# Patient Record
Sex: Female | Born: 1960 | Race: White | Hispanic: Yes | Smoking: Never smoker
Health system: Southern US, Community
[De-identification: ages and names within clinical notes are randomized; demographics above are authoritative.]

---

## 1999-02-10 ENCOUNTER — Inpatient Hospital Stay (HOSPITAL_COMMUNITY): Admission: AD | Admit: 1999-02-10 | Discharge: 1999-02-11 | Payer: Self-pay | Admitting: *Deleted

## 2008-07-07 ENCOUNTER — Inpatient Hospital Stay (HOSPITAL_COMMUNITY): Admission: AD | Admit: 2008-07-07 | Discharge: 2008-07-07 | Payer: Self-pay | Admitting: Obstetrics & Gynecology

## 2009-03-17 ENCOUNTER — Ambulatory Visit: Payer: Self-pay | Admitting: Gynecology

## 2009-03-24 ENCOUNTER — Ambulatory Visit: Payer: Self-pay | Admitting: Gynecology

## 2009-04-11 ENCOUNTER — Ambulatory Visit: Payer: Self-pay | Admitting: Gynecology

## 2009-04-14 ENCOUNTER — Ambulatory Visit (HOSPITAL_COMMUNITY): Admission: RE | Admit: 2009-04-14 | Discharge: 2009-04-14 | Payer: Self-pay | Admitting: Gynecology

## 2009-04-25 ENCOUNTER — Ambulatory Visit: Payer: Self-pay | Admitting: Gynecology

## 2010-06-20 LAB — WET PREP, GENITAL
Trich, Wet Prep: NONE SEEN
Yeast Wet Prep HPF POC: NONE SEEN

## 2011-05-24 ENCOUNTER — Ambulatory Visit
Admission: RE | Admit: 2011-05-24 | Discharge: 2011-05-24 | Disposition: A | Discharge status: Home / Self Care | Payer: No Typology Code available for payment source | Source: Ambulatory Visit | Attending: Geriatric Medicine | Admitting: Geriatric Medicine

## 2011-05-24 ENCOUNTER — Other Ambulatory Visit: Payer: Self-pay | Admitting: Geriatric Medicine

## 2011-05-24 DIAGNOSIS — R102 Pelvic and perineal pain: Secondary | ICD-10-CM

## 2012-10-22 ENCOUNTER — Emergency Department (HOSPITAL_COMMUNITY): Payer: Medicaid Other

## 2012-10-22 ENCOUNTER — Encounter (HOSPITAL_COMMUNITY): Payer: Self-pay | Admitting: Emergency Medicine

## 2012-10-22 ENCOUNTER — Observation Stay (HOSPITAL_COMMUNITY)
Admission: EM | Admit: 2012-10-22 | Discharge: 2012-10-24 | Disposition: A | Discharge status: Home / Self Care | Payer: Medicaid Other | Attending: Surgery | Admitting: Surgery

## 2012-10-22 DIAGNOSIS — K8020 Calculus of gallbladder without cholecystitis without obstruction: Secondary | ICD-10-CM

## 2012-10-22 DIAGNOSIS — R109 Unspecified abdominal pain: Secondary | ICD-10-CM

## 2012-10-22 DIAGNOSIS — K8000 Calculus of gallbladder with acute cholecystitis without obstruction: Principal | ICD-10-CM | POA: Insufficient documentation

## 2012-10-22 DIAGNOSIS — K8010 Calculus of gallbladder with chronic cholecystitis without obstruction: Secondary | ICD-10-CM

## 2012-10-22 DIAGNOSIS — Z79899 Other long term (current) drug therapy: Secondary | ICD-10-CM | POA: Insufficient documentation

## 2012-10-22 DIAGNOSIS — K8050 Calculus of bile duct without cholangitis or cholecystitis without obstruction: Secondary | ICD-10-CM | POA: Diagnosis present

## 2012-10-22 LAB — URINALYSIS, ROUTINE W REFLEX MICROSCOPIC
Glucose, UA: NEGATIVE mg/dL
Ketones, ur: NEGATIVE mg/dL
Protein, ur: NEGATIVE mg/dL

## 2012-10-22 LAB — COMPREHENSIVE METABOLIC PANEL
BUN: 16 mg/dL (ref 6–23)
CO2: 25 mEq/L (ref 19–32)
Chloride: 104 mEq/L (ref 96–112)
Creatinine, Ser: 0.69 mg/dL (ref 0.50–1.10)
GFR calc non Af Amer: >90 mL/min (ref >90)
Total Bilirubin: 0.3 mg/dL (ref 0.3–1.2)

## 2012-10-22 LAB — CBC WITH DIFFERENTIAL/PLATELET
Eosinophils Absolute: 0.1 10*3/uL (ref 0.0–0.7)
Lymphocytes Relative: 21 % (ref 12–46)
Lymphs Abs: 1.7 10*3/uL (ref 0.7–4.0)
Neutrophils Relative %: 72 % (ref 43–77)
Platelets: 222 10*3/uL (ref 150–400)
RBC: 4.81 MIL/uL (ref 3.87–5.11)
WBC: 8 10*3/uL (ref 4.0–10.5)

## 2012-10-22 LAB — URINE MICROSCOPIC-ADD ON

## 2012-10-22 MED ORDER — MORPHINE SULFATE 4 MG/ML IJ SOLN: 4.0000 mg | Freq: Once | INTRAMUSCULAR | Status: DC

## 2012-10-22 MED ORDER — CIPROFLOXACIN IN D5W 400 MG/200ML IV SOLN
400.0000 mg | Freq: Two times a day (BID) | INTRAVENOUS | Status: DC
  Administered 2012-10-22 – 2012-10-23 (×2): 400 mg via INTRAVENOUS
  Filled 2012-10-22 (×4): qty 200

## 2012-10-22 MED ORDER — HYDROMORPHONE HCL PF 1 MG/ML IJ SOLN
0.5000 mg | INTRAMUSCULAR | Status: DC | PRN
  Administered 2012-10-22: 1 mg via INTRAVENOUS
  Administered 2012-10-23 (×2): 0.5 mg via INTRAVENOUS
  Administered 2012-10-23 – 2012-10-24 (×5): 1 mg via INTRAVENOUS
  Filled 2012-10-22 (×7): qty 1

## 2012-10-22 MED ORDER — OMEPRAZOLE 20 MG PO CPDR: 20.0000 mg | DELAYED_RELEASE_CAPSULE | Freq: Every day | ORAL | Status: DC

## 2012-10-22 MED ORDER — DIPHENHYDRAMINE HCL 12.5 MG/5ML PO ELIX
12.5000 mg | ORAL_SOLUTION | Freq: Four times a day (QID) | ORAL | Status: DC | PRN
Start: 2012-10-22 — End: 2012-10-24

## 2012-10-22 MED ORDER — MORPHINE SULFATE 4 MG/ML IJ SOLN
4.0000 mg | Freq: Once | INTRAMUSCULAR | Status: AC
  Administered 2012-10-22: 4 mg via INTRAVENOUS
  Filled 2012-10-22: qty 1

## 2012-10-22 MED ORDER — DIPHENHYDRAMINE HCL 50 MG/ML IJ SOLN
12.5000 mg | Freq: Four times a day (QID) | INTRAMUSCULAR | Status: DC | PRN
  Administered 2012-10-22: 25 mg via INTRAVENOUS
  Filled 2012-10-22: qty 1

## 2012-10-22 MED ORDER — ONDANSETRON 4 MG PO TBDP
4.0000 mg | ORAL_TABLET | Freq: Once | ORAL | Status: AC
  Administered 2012-10-22: 4 mg via ORAL
  Filled 2012-10-22: qty 1

## 2012-10-22 MED ORDER — CIPROFLOXACIN IN D5W 400 MG/200ML IV SOLN: 400.0000 mg | Freq: Once | INTRAVENOUS | Status: DC

## 2012-10-22 MED ORDER — GI COCKTAIL ~~LOC~~
30.0000 mL | Freq: Once | ORAL | Status: AC
  Administered 2012-10-22: 30 mL via ORAL
  Filled 2012-10-22: qty 30

## 2012-10-22 MED ORDER — ONDANSETRON HCL 4 MG/2ML IJ SOLN
4.0000 mg | Freq: Four times a day (QID) | INTRAMUSCULAR | Status: DC | PRN
  Administered 2012-10-22: 4 mg via INTRAVENOUS
  Filled 2012-10-22 (×2): qty 2

## 2012-10-22 MED ORDER — KCL IN DEXTROSE-NACL 20-5-0.45 MEQ/L-%-% IV SOLN
INTRAVENOUS | Status: DC
  Administered 2012-10-23 – 2012-10-24 (×3): via INTRAVENOUS
  Filled 2012-10-22 (×8): qty 1000

## 2012-10-22 MED ORDER — MORPHINE SULFATE 4 MG/ML IJ SOLN
4.0000 mg | Freq: Once | INTRAMUSCULAR | Status: AC
  Administered 2012-10-22: 4 mg via INTRAMUSCULAR
  Filled 2012-10-22: qty 1

## 2012-10-22 NOTE — ED Notes (Signed)
Patient in Ultrasound.

## 2012-10-22 NOTE — ED Notes (Signed)
Pt has epigastric pain but no abdominal tenderness. Worse with movement. Afebrile.

## 2012-10-22 NOTE — ED Provider Notes (Signed)
CSN: [id number]     Arrival date & time 10/22/12  0751 History     First MD Initiated Contact with Patient 10/22/12 0800     Chief Complaint  Patient presents with  . Abdominal Pain   (Consider location/radiation/quality/duration/timing/severity/associated sxs/prior Treatment) HPI 52 year old female with a significant medical history presents with epigastric abdominal pain of 2 days duration. Patient reports that the pain began day before yesterday, with mild originally and alleviated by go, however yesterday appreciate constant more severe pain all day and the pain was so severe last night that kept her from sleep.  Patient reports that the pain is worse and worse with palpation and walking, and is unassociated with eating.  It radiates to the back.  Says that she has an appetite however has nausea which is keeping her from eating. Notes urinary frequency, but reports she believes is from drinking more liquids. Denies any vaginal bleeding or discharge. Denies diarrhea, constipation, blood in stool, fever.  No past medical history on file. No past surgical history on file. No family history on file. History  Substance Use Topics  . Smoking status: Never Smoker   . Smokeless tobacco: Not on file  . Alcohol Use: No   OB History   Grav Para Term Preterm Abortions TAB SAB Ect Mult Living                 Review of Systems  Constitutional: Negative for fever.  HENT: Negative for sore throat and neck pain.   Eyes: Negative for visual disturbance.  Respiratory: Negative for cough and shortness of breath.   Cardiovascular: Negative for chest pain.  Gastrointestinal: Positive for nausea and abdominal pain. Negative for vomiting, diarrhea, constipation and blood in stool.  Genitourinary: Negative for difficulty urinating.  Musculoskeletal: Positive for back pain.  Skin: Negative for rash.  Neurological: Negative for syncope and headaches.    Allergies  Shrimp  Home Medications    Current Outpatient Rx  Name  Route  Sig  Dispense  Refill  . ibuprofen (ADVIL,MOTRIN) 200 MG tablet   Oral   Take 400-600 mg by mouth daily as needed for pain. Usually takes 2 tablets (400 mg), 3 tablets (600 mg) for severe pain         . OVER THE COUNTER MEDICATION   Oral   Take 1 tablet by mouth daily. Vitamin for bones         . omeprazole (PRILOSEC) 20 MG capsule   Oral   Take 1 capsule (20 mg total) by mouth daily.   30 capsule   0    BP 142/94  Pulse 69  Temp(Src) 98.7 F (37.1 C) (Oral)  Resp 16  SpO2 95% Physical Exam  Nursing note and vitals reviewed. Constitutional: She is oriented to person, place, and time. She appears well-developed and well-nourished. No distress.  HENT:  Head: Normocephalic and atraumatic.  Eyes: Conjunctivae and EOM are normal.  Neck: Normal range of motion.  Cardiovascular: Normal rate, regular rhythm, normal heart sounds and intact distal pulses.  Exam reveals no gallop and no friction rub.   No murmur heard. Pulmonary/Chest: Effort normal and breath sounds normal. No respiratory distress. She has no wheezes. She has no rales.  Abdominal: Soft. She exhibits no distension. There is tenderness in the epigastric area. There is no guarding, no CVA tenderness, no tenderness at McBurney's point and negative Murphy's sign.  Musculoskeletal: She exhibits no edema and no tenderness.  Neurological: She is alert and oriented  to person, place, and time.  Skin: Skin is warm and dry. No rash noted. She is not diaphoretic. No erythema.    ED Course   Procedures (including critical care time)  Labs Reviewed  URINALYSIS, ROUTINE W REFLEX MICROSCOPIC - Abnormal; Notable for the following:    Hgb urine dipstick MODERATE (*)    Leukocytes, UA TRACE (*)    All other components within normal limits  COMPREHENSIVE METABOLIC PANEL - Abnormal; Notable for the following:    Glucose, Bld 117 (*)    All other components within normal limits  CBC WITH  DIFFERENTIAL - Abnormal; Notable for the following:    MCHC 36.6 (*)    All other components within normal limits  URINE CULTURE  LIPASE, BLOOD  URINE MICROSCOPIC-ADD ON   US Abdomen Complete  10/22/2012   *RADIOLOGY REPORT*  Clinical Data:  Epigastric pain  ABDOMINAL ULTRASOUND COMPLETE  Comparison:  None.  Findings:  Gallbladder:  Large stone is noted in the neck of the gallbladder. No gallbladder wall thickening or pericholecystic fluid is noted. No sonographic Murphy's sign is noted.  Common Bile Duct:  Measures 3.5 mm which is within normal limits in caliber.  Liver: No focal mass lesion identified.  Within normal limits in parenchymal echogenicity.  IVC:  Appears normal.  Pancreas:  No abnormality identified.  Spleen:  Within normal limits in size and echotexture.  Right kidney:  Measures 10.6 cm in length. Normal in size and parenchymal echogenicity.  No evidence of mass or hydronephrosis.  Left kidney:  Measures 10.1 cm in length. Normal in size and parenchymal echogenicity.  No evidence of mass or hydronephrosis.  Abdominal Aorta:  No aneurysm identified.  IMPRESSION: Large stone is noted in the neck of the gallbladder.  No gallbladder wall thickening or pericholecystic fluid is noted.   Original Report Authenticated By: Lupita Raider.,  M.D.   1. Abdominal pain   2. Cholelithiasis     MDM  52 year old female with a significant medical history presents with epigastric abdominal pain of 2 days duration.  Vital signs within normal limits and no signs of dehydration on exam.  Differential diagnosis includes pancreatitis, hepatitis, PUD, GERD, pyelonephritis, cholecystitis, UTI, or nephrolithiasis, appendicitis.  Doubt pelvic etiology given epigastric location of pain.  Doubt intrathoracic etiology given tenderness on exam. Urinalysis sent to evaluate for signs of infection and was WNL.  Lipase WNL. CMP evaluated for signs of hepatitis, electrolyte abnormalities and WNL.  While patient mildly  tender, abdominal exam is benign with negative McBurney's.  Patient originally with little RUQ tenderness, however on reexamination after receiving pain medications has epigastric tenderness and RUQ [place] ordered showing large stone in gallbladder neck without wall thickening or pericholecystic fluid.  General Surgery was consulted and will admit patient for possible cholecystectomy tomorrow.  Pain treated with morphine and given zofran for nausea.   Rhae Lerner, MD 10/22/12 1316

## 2012-10-22 NOTE — ED Notes (Signed)
Pt reports pain has improved

## 2012-10-22 NOTE — ED Provider Notes (Signed)
I saw and evaluated the patient, reviewed the resident's note and I agree with the findings and plan. Patient with epigastric tenderness. His proximal gallbladder stone. Continued tenderness will be admitted to surgery  Juliet Rude. Rubin Payor, MD 10/22/12 [phone]

## 2012-10-22 NOTE — ED Notes (Signed)
Epigastric pain radiates to back worse with walking, nausea since day before yesterday. Some urinary frequency.

## 2012-10-22 NOTE — ED Notes (Addendum)
Consent form signed at this time. Tresa Endo, PA answered questions and explained procedure.

## 2012-10-22 NOTE — H&P (Signed)
Shannon Eaton Nov 22, 1960  [id number].   Primary Care MD: none Chief Complaint/Reason for Consult: abdominal pain, gallstones HPI: This is a 52 yo Hispanic female who had an episode of epigastric abdominal pain 3 weeks ago.  It went away with Advil.  2 days ago she started having epigastric and RUQ abdominal pain again.  This did not go away with advil or tylenol.  She has had some nausea, but no emesis.  She came to the [facility] today for further evaluation where she was found to have a gallstone lodged in the neck of the gallbladder.  All of her labs were normal, but she was still having pain even after pain medicines were given.  We have been asked to evaluate the patient for possible admission.  Review of Systems: Please see HPI, otherwise all other systems have been reviewed and are negative.  No family history on file.  No past medical history on file.  No past surgical history on file.  Social History:  reports that she has never smoked. She does not have any smokeless tobacco history on file. She reports that she does not drink alcohol. Her drug history is not on file.  Allergies:  Allergies  Allergen Reactions  . Shrimp [Shellfish Allergy] Other (See Comments)    Shrimp ceviche causes tongues and gums to swell     (Not in a hospital admission)  Blood pressure 142/94, pulse 74, temperature 98.7 F (37.1 C), temperature source Oral, resp. rate 16, SpO2 96.00%. Physical Exam: General: pleasant, WD, WN Hispanic female who is laying in bed in NAD HEENT: head is normocephalic, atraumatic.  Sclera are noninjected.  PERRL.  Ears and nose without any masses or lesions.  Mouth is pink and moist Heart: regular, rate, and rhythm.  Normal s1,s2. No obvious murmurs, gallops, or rubs noted.  Palpable radial and pedal pulses bilaterally Lungs: CTAB, no wheezes, rhonchi, or rales noted.  Respiratory effort nonlabored Abd: soft, minimal RUQ and epigastric tenderness, ND, +BS, no masses or  organomegaly.  Small reducible umbilical hernia MS: all 4 extremities are symmetrical with no cyanosis, clubbing, or edema. Skin: warm and dry with no masses, lesions, or rashes Psych: A&Ox3 with an appropriate affect.    Results for orders placed during the hospital encounter of 10/22/12 (from the past 48 hour(s))  LIPASE, BLOOD     Status: None   Collection Time    10/22/12  8:33 AM      Result Value Range   Lipase 36  11 - 59 U/L  COMPREHENSIVE METABOLIC PANEL     Status: Abnormal   Collection Time    10/22/12  8:33 AM      Result Value Range   Sodium 140  135 - 145 mEq/L   Potassium 3.8  3.5 - 5.1 mEq/L   Chloride 104  96 - 112 mEq/L   CO2 25  19 - 32 mEq/L   Glucose, Bld 117 (*) 70 - 99 mg/dL   BUN 16  6 - 23 mg/dL   Creatinine, Ser 6.21  0.50 - 1.10 mg/dL   Calcium 30.8  8.4 - 65.7 mg/dL   Total Protein 7.4  6.0 - 8.3 g/dL   Albumin 4.1  3.5 - 5.2 g/dL   AST 27  0 - 37 U/L   ALT 28  0 - 35 U/L   Alkaline Phosphatase 72  39 - 117 U/L   Total Bilirubin 0.3  0.3 - 1.2 mg/dL   GFR calc non Af  Amer >90  >90 mL/min   GFR calc Af Amer >90  >90 mL/min   Comment: (NOTE)     The eGFR has been calculated using the CKD EPI equation.     This calculation has not been validated in all clinical situations.     eGFR's persistently <90 mL/min signify possible Chronic Kidney     Disease.  CBC WITH DIFFERENTIAL     Status: Abnormal   Collection Time    10/22/12  8:33 AM      Result Value Range   WBC 8.0  4.0 - 10.5 K/uL   RBC 4.81  3.87 - 5.11 MIL/uL   Hemoglobin 14.6  12.0 - 15.0 g/dL   HCT 16.1  09.6 - 04.5 %   MCV 83.0  78.0 - 100.0 fL   MCH 30.4  26.0 - 34.0 pg   MCHC 36.6 (*) 30.0 - 36.0 g/dL   RDW 40.9  81.1 - 91.4 %   Platelets 222  150 - 400 K/uL   Neutrophils Relative % 72  43 - 77 %   Neutro Abs 5.8  1.7 - 7.7 K/uL   Lymphocytes Relative 21  12 - 46 %   Lymphs Abs 1.7  0.7 - 4.0 K/uL   Monocytes Relative 6  3 - 12 %   Monocytes Absolute 0.5  0.1 - 1.0 K/uL    Eosinophils Relative 1  0 - 5 %   Eosinophils Absolute 0.1  0.0 - 0.7 K/uL   Basophils Relative 0  0 - 1 %   Basophils Absolute 0.0  0.0 - 0.1 K/uL  URINALYSIS, ROUTINE W REFLEX MICROSCOPIC     Status: Abnormal   Collection Time    10/22/12  8:36 AM      Result Value Range   Color, Urine YELLOW  YELLOW   APPearance CLEAR  CLEAR   Specific Gravity, Urine 1.025  1.005 - 1.030   pH 7.0  5.0 - 8.0   Glucose, UA NEGATIVE  NEGATIVE mg/dL   Hgb urine dipstick MODERATE (*) NEGATIVE   Bilirubin Urine NEGATIVE  NEGATIVE   Ketones, ur NEGATIVE  NEGATIVE mg/dL   Protein, ur NEGATIVE  NEGATIVE mg/dL   Urobilinogen, UA 0.2  0.0 - 1.0 mg/dL   Nitrite NEGATIVE  NEGATIVE   Leukocytes, UA TRACE (*) NEGATIVE  URINE MICROSCOPIC-ADD ON     Status: None   Collection Time    10/22/12  8:36 AM      Result Value Range   Squamous Epithelial / LPF RARE  RARE   WBC, UA 7-10  <3 WBC/hpf   Urine-Other MUCOUS PRESENT     US Abdomen Complete  10/22/2012   *RADIOLOGY REPORT*  Clinical Data:  Epigastric pain  ABDOMINAL ULTRASOUND COMPLETE  Comparison:  None.  Findings:  Gallbladder:  Large stone is noted in the neck of the gallbladder. No gallbladder wall thickening or pericholecystic fluid is noted. No sonographic Murphy's sign is noted.  Common Bile Duct:  Measures 3.5 mm which is within normal limits in caliber.  Liver: No focal mass lesion identified.  Within normal limits in parenchymal echogenicity.  IVC:  Appears normal.  Pancreas:  No abnormality identified.  Spleen:  Within normal limits in size and echotexture.  Right kidney:  Measures 10.6 cm in length. Normal in size and parenchymal echogenicity.  No evidence of mass or hydronephrosis.  Left kidney:  Measures 10.1 cm in length. Normal in size and parenchymal echogenicity.  No evidence of mass  or hydronephrosis.  Abdominal Aorta:  No aneurysm identified.  IMPRESSION: Large stone is noted in the neck of the gallbladder.  No gallbladder wall thickening or  pericholecystic fluid is noted.   Original Report Authenticated By: Lupita Raider.,  M.D.       Assessment/Plan 1. Biliary colic  Plan: 1. We will get the patient admitted and try to do her surgery today.  If we can't then we will proceed tomorrow. 2. Will keep NPO for now, but if unable to do today will give clear liquids tonight and NPO p MN. 3. abx on call to OR 4. Explained all of this to the patient via interpretor line.  Sinclair Arrazola E 10/22/2012, 1:46 PM Pager: [phone]

## 2012-10-22 NOTE — H&P (Signed)
Patient seen with KO,PA. She is relatively asx now. Will plan for lap chole tomorrow.

## 2012-10-23 ENCOUNTER — Observation Stay (HOSPITAL_COMMUNITY): Payer: Medicaid Other

## 2012-10-23 ENCOUNTER — Encounter (HOSPITAL_COMMUNITY): Payer: Self-pay | Admitting: Certified Registered"

## 2012-10-23 ENCOUNTER — Observation Stay (HOSPITAL_COMMUNITY): Payer: Medicaid Other | Admitting: Anesthesiology

## 2012-10-23 ENCOUNTER — Encounter (HOSPITAL_COMMUNITY)
Admission: EM | Disposition: A | Discharge status: Home / Self Care | Payer: Self-pay | Source: Home / Self Care | Attending: Emergency Medicine

## 2012-10-23 ENCOUNTER — Encounter (HOSPITAL_COMMUNITY): Payer: Self-pay | Admitting: Anesthesiology

## 2012-10-23 HISTORY — PX: CHOLECYSTECTOMY: SHX55

## 2012-10-23 LAB — URINE CULTURE: Colony Count: 7000

## 2012-10-23 LAB — CREATININE, SERUM
Creatinine, Ser: 0.67 mg/dL (ref 0.50–1.10)
GFR calc Af Amer: >90 mL/min (ref >90)
GFR calc non Af Amer: >90 mL/min (ref >90)

## 2012-10-23 LAB — SURGICAL PCR SCREEN: MRSA, PCR: NEGATIVE

## 2012-10-23 LAB — CBC
HCT: 39 % (ref 36.0–46.0)
Hemoglobin: 13.5 g/dL (ref 12.0–15.0)
MCHC: 34.6 g/dL (ref 30.0–36.0)
RBC: 4.6 MIL/uL (ref 3.87–5.11)

## 2012-10-23 SURGERY — LAPAROSCOPIC CHOLECYSTECTOMY WITH INTRAOPERATIVE CHOLANGIOGRAM
Anesthesia: General | Wound class: Clean Contaminated

## 2012-10-23 MED ORDER — SODIUM CHLORIDE 0.9 % IV SOLN
INTRAVENOUS | Status: DC | PRN
  Administered 2012-10-23: 13:00:00

## 2012-10-23 MED ORDER — HYDROMORPHONE HCL PF 1 MG/ML IJ SOLN
INTRAMUSCULAR | Status: AC
  Filled 2012-10-23: qty 1

## 2012-10-23 MED ORDER — CHLORHEXIDINE GLUCONATE 4 % EX LIQD
1.0000 "application " | Freq: Once | CUTANEOUS | Status: DC
  Filled 2012-10-23: qty 15

## 2012-10-23 MED ORDER — OXYCODONE HCL 5 MG PO TABS
5.0000 mg | ORAL_TABLET | Freq: Once | ORAL | Status: DC | PRN
Start: 2012-10-23 — End: 2012-10-23

## 2012-10-23 MED ORDER — HYDROMORPHONE HCL PF 1 MG/ML IJ SOLN
0.2500 mg | INTRAMUSCULAR | Status: DC | PRN
  Administered 2012-10-23 (×3): 0.5 mg via INTRAVENOUS

## 2012-10-23 MED ORDER — LACTATED RINGERS IV SOLN
INTRAVENOUS | Status: DC
  Administered 2012-10-23: 11:00:00 via INTRAVENOUS

## 2012-10-23 MED ORDER — OXYCODONE HCL 5 MG/5ML PO SOLN: 5.0000 mg | Freq: Once | ORAL | Status: DC | PRN

## 2012-10-23 MED ORDER — ONDANSETRON HCL 4 MG/2ML IJ SOLN
INTRAMUSCULAR | Status: DC | PRN
  Administered 2012-10-23: 4 mg via INTRAVENOUS

## 2012-10-23 MED ORDER — SUFENTANIL CITRATE 50 MCG/ML IV SOLN
INTRAVENOUS | Status: DC | PRN
  Administered 2012-10-23 (×2): 10 ug via INTRAVENOUS

## 2012-10-23 MED ORDER — LIDOCAINE HCL (CARDIAC) 20 MG/ML IV SOLN
INTRAVENOUS | Status: DC | PRN
  Administered 2012-10-23: 100 mg via INTRAVENOUS

## 2012-10-23 MED ORDER — LACTATED RINGERS IV SOLN
INTRAVENOUS | Status: DC | PRN
  Administered 2012-10-23 (×2): via INTRAVENOUS

## 2012-10-23 MED ORDER — SODIUM CHLORIDE 0.9 % IR SOLN
Status: DC | PRN
  Administered 2012-10-23: 1000 mL

## 2012-10-23 MED ORDER — ROCURONIUM BROMIDE 100 MG/10ML IV SOLN
INTRAVENOUS | Status: DC | PRN
  Administered 2012-10-23: 50 mg via INTRAVENOUS

## 2012-10-23 MED ORDER — GLYCOPYRROLATE 0.2 MG/ML IJ SOLN
INTRAMUSCULAR | Status: DC | PRN
  Administered 2012-10-23: 0.4 mg via INTRAVENOUS

## 2012-10-23 MED ORDER — BUPIVACAINE HCL (PF) 0.25 % IJ SOLN
INTRAMUSCULAR | Status: DC | PRN
  Administered 2012-10-23: 30 mL

## 2012-10-23 MED ORDER — CHLORHEXIDINE GLUCONATE 4 % EX LIQD
1.0000 "application " | Freq: Once | CUTANEOUS | Status: AC
  Administered 2012-10-23: 1 via TOPICAL
  Filled 2012-10-23: qty 15

## 2012-10-23 MED ORDER — CEFAZOLIN SODIUM-DEXTROSE 2-3 GM-% IV SOLR
2.0000 g | INTRAVENOUS | Status: AC
  Administered 2012-10-23: 2 g via INTRAVENOUS
  Filled 2012-10-23: qty 50

## 2012-10-23 MED ORDER — DEXAMETHASONE SODIUM PHOSPHATE 4 MG/ML IJ SOLN
INTRAMUSCULAR | Status: DC | PRN
  Administered 2012-10-23: 4 mg via INTRAVENOUS

## 2012-10-23 MED ORDER — EPHEDRINE SULFATE 50 MG/ML IJ SOLN
INTRAMUSCULAR | Status: DC | PRN
  Administered 2012-10-23: 10 mg via INTRAVENOUS

## 2012-10-23 MED ORDER — MIDAZOLAM HCL 5 MG/5ML IJ SOLN
INTRAMUSCULAR | Status: DC | PRN
  Administered 2012-10-23: 2 mg via INTRAVENOUS

## 2012-10-23 MED ORDER — HEPARIN SODIUM (PORCINE) 5000 UNIT/ML IJ SOLN
5000.0000 [IU] | Freq: Three times a day (TID) | INTRAMUSCULAR | Status: DC
  Filled 2012-10-23 (×3): qty 1

## 2012-10-23 MED ORDER — OXYCODONE-ACETAMINOPHEN 5-325 MG PO TABS
1.0000 | ORAL_TABLET | ORAL | Status: DC | PRN
  Administered 2012-10-23 – 2012-10-24 (×3): 2 via ORAL
  Filled 2012-10-23 (×3): qty 2

## 2012-10-23 MED ORDER — BUPIVACAINE HCL (PF) 0.25 % IJ SOLN
INTRAMUSCULAR | Status: AC
  Filled 2012-10-23: qty 30

## 2012-10-23 MED ORDER — FENTANYL CITRATE 0.05 MG/ML IJ SOLN
INTRAMUSCULAR | Status: DC | PRN
  Administered 2012-10-23 (×2): 50 ug via INTRAVENOUS

## 2012-10-23 MED ORDER — NEOSTIGMINE METHYLSULFATE 1 MG/ML IJ SOLN
INTRAMUSCULAR | Status: DC | PRN
  Administered 2012-10-23: 3 mg via INTRAVENOUS

## 2012-10-23 MED ORDER — FENTANYL CITRATE 0.05 MG/ML IJ SOLN
INTRAMUSCULAR | Status: AC
  Filled 2012-10-23: qty 2

## 2012-10-23 MED ORDER — 0.9 % SODIUM CHLORIDE (POUR BTL) OPTIME
TOPICAL | Status: DC | PRN
  Administered 2012-10-23: 1000 mL

## 2012-10-23 MED ORDER — ONDANSETRON HCL 4 MG/2ML IJ SOLN: 4.0000 mg | Freq: Four times a day (QID) | INTRAMUSCULAR | Status: DC | PRN

## 2012-10-23 MED ORDER — PROPOFOL 10 MG/ML IV BOLUS
INTRAVENOUS | Status: DC | PRN
  Administered 2012-10-23: 160 mg via INTRAVENOUS

## 2012-10-23 SURGICAL SUPPLY — 45 items
ADH SKN CLS APL DERMABOND .7 (GAUZE/BANDAGES/DRESSINGS) ×1
APPLIER CLIP ROT 10 11.4 M/L (STAPLE) ×2
APR CLP MED LRG 11.4X10 (STAPLE) ×1
BAG SPEC RTRVL LRG 6X4 10 (ENDOMECHANICALS) ×1
BLADE SURG ROTATE 9660 (MISCELLANEOUS) IMPLANT
CANISTER SUCTION 2500CC (MISCELLANEOUS) ×2 IMPLANT
CHLORAPREP W/TINT 26ML (MISCELLANEOUS) ×2 IMPLANT
CLIP APPLIE ROT 10 11.4 M/L (STAPLE) ×1 IMPLANT
CLOTH BEACON ORANGE TIMEOUT ST (SAFETY) ×2 IMPLANT
COVER MAYO STAND STRL (DRAPES) ×2 IMPLANT
COVER SURGICAL LIGHT HANDLE (MISCELLANEOUS) ×2 IMPLANT
DECANTER SPIKE VIAL GLASS SM (MISCELLANEOUS) ×2 IMPLANT
DERMABOND ADVANCED (GAUZE/BANDAGES/DRESSINGS) ×1
DERMABOND ADVANCED .7 DNX12 (GAUZE/BANDAGES/DRESSINGS) ×1 IMPLANT
DRAPE C-ARM 42X72 X-RAY (DRAPES) ×2 IMPLANT
DRAPE UTILITY 15X26 W/TAPE STR (DRAPE) ×4 IMPLANT
ELECT REM PT RETURN 9FT ADLT (ELECTROSURGICAL) ×2
ELECTRODE REM PT RTRN 9FT ADLT (ELECTROSURGICAL) ×1 IMPLANT
FILTER SMOKE EVAC LAPAROSHD (FILTER) IMPLANT
GLOVE BIO SURGEON STRL SZ7 (GLOVE) ×1 IMPLANT
GLOVE BIO SURGEON STRL SZ7.5 (GLOVE) ×1 IMPLANT
GLOVE BIOGEL PI IND STRL 7.0 (GLOVE) IMPLANT
GLOVE BIOGEL PI IND STRL 8 (GLOVE) IMPLANT
GLOVE BIOGEL PI INDICATOR 7.0 (GLOVE) ×1
GLOVE BIOGEL PI INDICATOR 8 (GLOVE) ×1
GLOVE EUDERMIC 7 POWDERFREE (GLOVE) ×2 IMPLANT
GOWN STRL NON-REIN LRG LVL3 (GOWN DISPOSABLE) ×6 IMPLANT
GOWN STRL REIN XL XLG (GOWN DISPOSABLE) ×2 IMPLANT
KIT BASIN OR (CUSTOM PROCEDURE TRAY) ×2 IMPLANT
KIT ROOM TURNOVER OR (KITS) ×2 IMPLANT
NS IRRIG 1000ML POUR BTL (IV SOLUTION) ×2 IMPLANT
PAD ARMBOARD 7.5X6 YLW CONV (MISCELLANEOUS) ×2 IMPLANT
POUCH SPECIMEN RETRIEVAL 10MM (ENDOMECHANICALS) ×2 IMPLANT
SCISSORS LAP 5X35 DISP (ENDOMECHANICALS) IMPLANT
SET CHOLANGIOGRAPH 5 50 .035 (SET/KITS/TRAYS/PACK) ×2 IMPLANT
SET IRRIG TUBING LAPAROSCOPIC (IRRIGATION / IRRIGATOR) ×2 IMPLANT
SLEEVE ENDOPATH XCEL 5M (ENDOMECHANICALS) ×2 IMPLANT
SPECIMEN JAR SMALL (MISCELLANEOUS) ×2 IMPLANT
SUT MNCRL AB 4-0 PS2 18 (SUTURE) ×2 IMPLANT
TOWEL OR 17X24 6PK STRL BLUE (TOWEL DISPOSABLE) ×2 IMPLANT
TOWEL OR 17X26 10 PK STRL BLUE (TOWEL DISPOSABLE) ×2 IMPLANT
TRAY LAPAROSCOPIC (CUSTOM PROCEDURE TRAY) ×2 IMPLANT
TROCAR XCEL BLUNT TIP 100MML (ENDOMECHANICALS) ×2 IMPLANT
TROCAR XCEL NON-BLD 11X100MML (ENDOMECHANICALS) ×2 IMPLANT
TROCAR XCEL NON-BLD 5MMX100MML (ENDOMECHANICALS) ×2 IMPLANT

## 2012-10-23 NOTE — Progress Notes (Signed)
Biliary colic  Assessment: Acute cholecystitis  Plan: Lap chole as planned   Subjective: Still with some abd pain, no worse than yesterday, still mid high epigastric  Objective: Vital signs in last 24 hours: Temp:  [97.5 F (36.4 C)-98.7 F (37.1 C)] 98.4 F (36.9 C) (08/15 0344) Pulse Rate:  [57-83] 66 (08/15 0344) Resp:  [16-18] 16 (08/15 0344) BP: (111-142)/(69-94) 128/79 mmHg (08/15 0344) SpO2:  [95 %-100 %] 100 % (08/15 0344) Last BM Date: 10/22/12  Intake/Output from previous day: 08/14 0701 - 08/15 0700 In: 1104.2 [I.V.:704.2; IV Piggyback:400] Out: 1 [Emesis/NG output:1]  General appearance: alert, cooperative and mild distress Resp: clear to auscultation bilaterally GI: Tenderm ruq and epigastric area, soft no guarding or rigidity  Lab Results:  Results for orders placed during the hospital encounter of 10/22/12 (from the past 24 hour(s))  LIPASE, BLOOD     Status: None   Collection Time    10/22/12  8:33 AM      Result Value Range   Lipase 36  11 - 59 U/L  COMPREHENSIVE METABOLIC PANEL     Status: Abnormal   Collection Time    10/22/12  8:33 AM      Result Value Range   Sodium 140  135 - 145 mEq/L   Potassium 3.8  3.5 - 5.1 mEq/L   Chloride 104  96 - 112 mEq/L   CO2 25  19 - 32 mEq/L   Glucose, Bld 117 (*) 70 - 99 mg/dL   BUN 16  6 - 23 mg/dL   Creatinine, Ser 0.98  0.50 - 1.10 mg/dL   Calcium 11.9  8.4 - 14.7 mg/dL   Total Protein 7.4  6.0 - 8.3 g/dL   Albumin 4.1  3.5 - 5.2 g/dL   AST 27  0 - 37 U/L   ALT 28  0 - 35 U/L   Alkaline Phosphatase 72  39 - 117 U/L   Total Bilirubin 0.3  0.3 - 1.2 mg/dL   GFR calc non Af Amer >90  >90 mL/min   GFR calc Af Amer >90  >90 mL/min  CBC WITH DIFFERENTIAL     Status: Abnormal   Collection Time    10/22/12  8:33 AM      Result Value Range   WBC 8.0  4.0 - 10.5 K/uL   RBC 4.81  3.87 - 5.11 MIL/uL   Hemoglobin 14.6  12.0 - 15.0 g/dL   HCT 82.9  56.2 - 13.0 %   MCV 83.0  78.0 - 100.0 fL   MCH 30.4  26.0  - 34.0 pg   MCHC 36.6 (*) 30.0 - 36.0 g/dL   RDW 86.5  78.4 - 69.6 %   Platelets 222  150 - 400 K/uL   Neutrophils Relative % 72  43 - 77 %   Neutro Abs 5.8  1.7 - 7.7 K/uL   Lymphocytes Relative 21  12 - 46 %   Lymphs Abs 1.7  0.7 - 4.0 K/uL   Monocytes Relative 6  3 - 12 %   Monocytes Absolute 0.5  0.1 - 1.0 K/uL   Eosinophils Relative 1  0 - 5 %   Eosinophils Absolute 0.1  0.0 - 0.7 K/uL   Basophils Relative 0  0 - 1 %   Basophils Absolute 0.0  0.0 - 0.1 K/uL  URINALYSIS, ROUTINE W REFLEX MICROSCOPIC     Status: Abnormal   Collection Time    10/22/12  8:36 AM  Result Value Range   Color, Urine YELLOW  YELLOW   APPearance CLEAR  CLEAR   Specific Gravity, Urine 1.025  1.005 - 1.030   pH 7.0  5.0 - 8.0   Glucose, UA NEGATIVE  NEGATIVE mg/dL   Hgb urine dipstick MODERATE (*) NEGATIVE   Bilirubin Urine NEGATIVE  NEGATIVE   Ketones, ur NEGATIVE  NEGATIVE mg/dL   Protein, ur NEGATIVE  NEGATIVE mg/dL   Urobilinogen, UA 0.2  0.0 - 1.0 mg/dL   Nitrite NEGATIVE  NEGATIVE   Leukocytes, UA TRACE (*) NEGATIVE  URINE MICROSCOPIC-ADD ON     Status: None   Collection Time    10/22/12  8:36 AM      Result Value Range   Squamous Epithelial / LPF RARE  RARE   WBC, UA 7-10  <3 WBC/hpf   Urine-Other MUCOUS PRESENT    SURGICAL PCR SCREEN     Status: None   Collection Time    10/23/12  3:57 AM      Result Value Range   MRSA, PCR NEGATIVE  NEGATIVE   Staphylococcus aureus NEGATIVE  NEGATIVE     Studies/Results Radiology     MEDS, Scheduled . ciprofloxacin  400 mg Intravenous Q12H       LOS: 1 day    Currie Paris, MD, [facility], [place] [phone]   10/23/2012 7:21 AM

## 2012-10-23 NOTE — Anesthesia Postprocedure Evaluation (Signed)
  Anesthesia Post-op Note  Patient: Shannon Eaton  Procedure(s) Performed: Procedure(s): LAPAROSCOPIC CHOLECYSTECTOMY WITH INTRAOPERATIVE CHOLANGIOGRAM (N/A)  Patient Location: PACU  Anesthesia Type:General  Level of Consciousness: awake and alert   Airway and Oxygen Therapy: Patient Spontanous Breathing and Patient connected to nasal cannula oxygen  Post-op Pain: mild  Post-op Assessment: Post-op Vital signs reviewed, Patient's Cardiovascular Status Stable, Respiratory Function Stable, Patent Airway and No signs of Nausea or vomiting  Post-op Vital Signs: Reviewed and stable  Complications: No apparent anesthesia complications

## 2012-10-23 NOTE — Op Note (Signed)
Brittanni ARROYO-MARES 02-10-1961 [id number] 10/22/2012  Preoperative diagnosis: acute calculus cholecystitis  Postoperative diagnosis: same  Procedure: laparoscopic cholecystectomy with intraoperative cholangiogram  Surgeon: Currie Paris, MD, FACS     Anesthesia: General  Clinical History and Indications: This patient was admitted yesterday afternoon with a gallstone impacted in the neck of the gallbladder and clinical signs of acute cholecystitis.  Description of procedure: The patient was seen in the preoperative area. I reviewed the plans for the procedure with her as well as the risks and complications. She had no further questions and wished to proceed.  The patient was taken to the operating room. After satisfactory general endotracheal anesthesia had been obtained the abdomen was prepped and draped. A time out was done.  0.25% plain Marcaine was used at all incisions. I made an umbilical incision, identified the fascia and opened that, and entered the peritoneal cavity under direct vision. A 0 Vicryl pursestring suture was placed and the Hasson cannula was introduced under direct vision and secured with the pursestring. The abdomen was inflated to 15 cm.  The camera was placed and there were no gross abnormalities. The patient was then placed in reverse Trendelenburg and tilted to the left. A 10/11 trocar was placed in the epigastrium and two 5 mm trochars placed laterally all under direct vision.  The gallbladder was edematous and very distended. I was unable to get a good grasper on it so decompressed. She had white bile present. Once that was done I could get appropriate retraction and open up the tissue around the cystic duct. I was able to dissect a long segment of cystic duct and anterior branch of the cystic artery and got a critical view. A clip was placed on the duct at the junction with the gallbladder and one on the artery.  An intraoperative cholangiogram was then  performed. A Cook catheter was introduced percutaneously and placed in the cystic duct. The cholangiogram showed good filling of the common duct and hepatic radicals and free flow into the duodenum. No abnormalities were noted.  The catheter was removed and 3 clips placed on the stay side of the cystic duct. The duct was then divided.  Additional clips are placed on the cystic artery and it was divided. The gallbladder was then removed from below to above the coagulation current of the cautery. Additional clips were placed to the posterior branch of the artery as we mobilized the gallbladder. It was then placed in a bag to be retrieved later.  The abdomen was irrigated and a check for hemostasis along the bed of the gallbladder made. Once everything appeared to be dry we were able to move the camera to the epigastric port and removed the gallbladder through the umbilical port.  The abdomen was reinsufflated and a final check for hemostasis made. There is no evidence of bleeding or bile leakage. The lateral ports were removed under direct vision and there was no bleeding. The umbilical site was closed with a pursestring, watching with the camera in the epigastric port. The abdomen was then deflated through the epigastric port and that was removed. Skin was closed with 4-0 Monocryl subcuticular and Dermabond.  The patient tolerated the procedure well. There were no operative complications. EBL was minimal. All counts were correct.  Currie Paris, MD, FACS 10/23/2012 1:43 PM

## 2012-10-23 NOTE — Preoperative (Signed)
Beta Blockers   Reason not to administer Beta Blockers:Not Applicable 

## 2012-10-23 NOTE — Transfer of Care (Signed)
Immediate Anesthesia Transfer of Care Note  Patient: Shannon Eaton  Procedure(s) Performed: Procedure(s): LAPAROSCOPIC CHOLECYSTECTOMY WITH INTRAOPERATIVE CHOLANGIOGRAM (N/A)  Patient Location: PACU  Anesthesia Type:General  Level of Consciousness: awake, alert  and oriented  Airway & Oxygen Therapy: Patient Spontanous Breathing and Patient connected to nasal cannula oxygen  Post-op Assessment: Report given to PACU RN  Post vital signs: Reviewed and stable  Complications: No apparent anesthesia complications

## 2012-10-23 NOTE — Anesthesia Preprocedure Evaluation (Addendum)
Anesthesia Evaluation  Patient identified by MRN, date of birth, ID band Patient awake    Reviewed: Allergy & Precautions, H&P , NPO status , Patient's Chart, lab work & pertinent test results  Airway Mallampati: II  Neck ROM: full    Dental  (+) Dental Advisory Given   Pulmonary          Cardiovascular     Neuro/Psych    GI/Hepatic   Endo/Other    Renal/GU      Musculoskeletal   Abdominal   Peds  Hematology   Anesthesia Other Findings   Reproductive/Obstetrics                          Anesthesia Physical Anesthesia Plan  ASA: I  Anesthesia Plan: General   Post-op Pain Management:    Induction: Intravenous  Airway Management Planned: Oral ETT  Additional Equipment:   Intra-op Plan:   Post-operative Plan: Extubation in OR  Informed Consent: I have reviewed the patients History and Physical, chart, labs and discussed the procedure including the risks, benefits and alternatives for the proposed anesthesia with the patient or authorized representative who has indicated his/her understanding and acceptance.     Plan Discussed with: CRNA, Anesthesiologist and Surgeon  Anesthesia Plan Comments:         Anesthesia Quick Evaluation

## 2012-10-24 MED ORDER — HYDROCODONE-ACETAMINOPHEN 5-325 MG PO TABS: 1.0000 | ORAL_TABLET | ORAL | Status: DC | PRN

## 2012-10-24 NOTE — Discharge Summary (Signed)
  Patient ID: Shannon Eaton [id number] y.o. May 14, 1960  Admit date: 10/22/2012  Discharge date and time: No discharge date for patient encounter.  Admitting Physician: Ernestene Mention  Discharge Physician: Ernestene Mention  Admission Diagnoses: Cholelithiasis [574.20] Abdominal pain [789.00]  Discharge Diagnoses: acute cholecystitis with cholelithiasis  Operations: Procedure(s): LAPAROSCOPIC CHOLECYSTECTOMY WITH INTRAOPERATIVE CHOLANGIOGRAM  Admission Condition: fair  Discharged Condition: good  Indication for Admission: This is a 52 yo Hispanic female who had an episode of epigastric abdominal pain 3 weeks ago. It went away with Advil. 2 days PTA she started having epigastric and RUQ abdominal pain again. This did not go away with advil or tylenol. She has had some nausea, but no emesis. She came to the [facility] today for further evaluation where she was found to have a gallstone lodged in the neck of the gallbladder. All of her labs were normal, but she was still having pain even after pain medicines were given. Following evaluation in the emergency department, the decision was made to admit the patient for cholecystectomy   Hospital Course: the patient was admitted for IV fluid resuscitation, pain control, and IV antibiotics. She was taken to the operating room on 10/23/2012 and she underwent a laparoscopic cholecystectomy with cholangiogram by Dr. Cyndia Bent. The cholangiogram was essentially normal. The patient progression of her diet and activities overnight. She felt well enough to go home the following morning. She ate breakfast and tolerated that. Abdominal exam the time of discharge revealed  the abdomen to be soft, minimally tender, nondistended wounds looked good. Her daughter was present for translations and we discussed diet and activities and followup. She was given a prescription for Vicodin for pain and will return to the office in 3 weeks.  Consults:  None  Significant Diagnostic Studies: ultrasound and labs and cholangiogram  Treatments: surgery: laparoscopic cholecystectomy with cholangiogram  Disposition: Home  Patient Instructions:    Medication List         HYDROcodone-acetaminophen 5-325 MG per tablet  Commonly known as:  NORCO/VICODIN  Take 1-2 tablets by mouth every 4 (four) hours as needed for pain.     ibuprofen 200 MG tablet  Commonly known as:  ADVIL,MOTRIN  Take 400-600 mg by mouth daily as needed for pain. Usually takes 2 tablets (400 mg), 3 tablets (600 mg) for severe pain     omeprazole 20 MG capsule  Commonly known as:  PRILOSEC  Take 1 capsule (20 mg total) by mouth daily.     OVER THE COUNTER MEDICATION  Take 1 tablet by mouth daily. Vitamin for bones        Activity: activity as tolerated Diet: low fat, low cholesterol diet Wound Care: none needed  Follow-up:  With Dr. Jamey Ripa in 3 weeks.  Signed: Angelia Mould. Derrell Lolling, M.D., FACS General and minimally invasive surgery Breast and Colorectal Surgery  10/24/2012, 8:22 AM

## 2012-10-26 ENCOUNTER — Encounter (HOSPITAL_COMMUNITY): Payer: Self-pay | Admitting: Surgery

## 2012-11-10 ENCOUNTER — Ambulatory Visit (INDEPENDENT_AMBULATORY_CARE_PROVIDER_SITE_OTHER): Payer: Medicaid Other | Admitting: Internal Medicine

## 2012-11-10 ENCOUNTER — Encounter (INDEPENDENT_AMBULATORY_CARE_PROVIDER_SITE_OTHER): Payer: Self-pay | Admitting: Internal Medicine

## 2012-11-10 VITALS — BP 118/70 | HR 68 | Resp 14 | Ht 65.0 in | Wt 150.2 lb

## 2012-11-10 DIAGNOSIS — K8000 Calculus of gallbladder with acute cholecystitis without obstruction: Secondary | ICD-10-CM

## 2012-11-10 MED ORDER — HYDROCODONE-ACETAMINOPHEN 5-325 MG PO TABS: 1.0000 | ORAL_TABLET | ORAL | Status: DC | PRN

## 2012-11-10 NOTE — Patient Instructions (Addendum)
May resume regular activity without restrictions. Follow up as needed. Call with questions or concerns.  

## 2012-11-10 NOTE — Progress Notes (Signed)
  Subjective: Pt returns to the clinic today after undergoing laparoscopic cholecystectomy on 10/23/12 by Dr. Jamey Ripa.  The patient is tolerating their diet well and is having no severe pain but is having some soreness in the back.  Bowel function is good.  No problems with the wounds.  Objective: Vital signs in last 24 hours: Reviewed  PE: Abd: soft, non-tender, +bs, incisions well healed  Lab Results:  No results found for this basename: WBC, HGB, HCT, PLT,  in the last 72 hours BMET No results found for this basename: NA, K, CL, CO2, GLUCOSE, BUN, CREATININE, CALCIUM,  in the last 72 hours PT/INR No results found for this basename: LABPROT, INR,  in the last 72 hours CMP     Component Value Date/Time   NA 140 10/22/2012 0833   K 3.8 10/22/2012 0833   CL 104 10/22/2012 0833   CO2 25 10/22/2012 0833   GLUCOSE 117* 10/22/2012 0833   BUN 16 10/22/2012 0833   CREATININE 0.67 10/23/2012 1735   CALCIUM 10.1 10/22/2012 0833   PROT 7.4 10/22/2012 0833   ALBUMIN 4.1 10/22/2012 0833   AST 27 10/22/2012 0833   ALT 28 10/22/2012 0833   ALKPHOS 72 10/22/2012 0833   BILITOT 0.3 10/22/2012 0833   GFRNONAA >90 10/23/2012 1735   GFRAA >90 10/23/2012 1735   Lipase     Component Value Date/Time   LIPASE 36 10/22/2012 0833       Studies/Results: No results found.  Anti-infectives: Anti-infectives   None       Assessment/Plan  1.  S/P Laparoscopic Cholecystectomy: doing well, may resume regular activity without restrictions, i will give her a refill of pain meds but she will have to follow up with a primary care MD if her pain in her back persist. Pt will follow up with [place] PRN and knows to call with questions or concerns.     Malon Siddall 11/10/2012

## 2013-05-19 ENCOUNTER — Encounter (HOSPITAL_COMMUNITY): Payer: Self-pay | Admitting: Emergency Medicine

## 2013-05-19 ENCOUNTER — Emergency Department (HOSPITAL_COMMUNITY): Payer: Self-pay

## 2013-05-19 ENCOUNTER — Emergency Department (HOSPITAL_COMMUNITY)
Admission: EM | Admit: 2013-05-19 | Discharge: 2013-05-19 | Disposition: A | Discharge status: Home / Self Care | Payer: Self-pay | Attending: Emergency Medicine | Admitting: Emergency Medicine

## 2013-05-19 DIAGNOSIS — K5900 Constipation, unspecified: Secondary | ICD-10-CM | POA: Insufficient documentation

## 2013-05-19 DIAGNOSIS — Z9089 Acquired absence of other organs: Secondary | ICD-10-CM | POA: Insufficient documentation

## 2013-05-19 DIAGNOSIS — R109 Unspecified abdominal pain: Secondary | ICD-10-CM

## 2013-05-19 DIAGNOSIS — R110 Nausea: Secondary | ICD-10-CM | POA: Insufficient documentation

## 2013-05-19 LAB — CBC WITH DIFFERENTIAL/PLATELET
BASOS ABS: 0 10*3/uL (ref 0.0–0.1)
BASOS PCT: 0 % (ref 0–1)
EOS PCT: 1 % (ref 0–5)
Eosinophils Absolute: 0.1 10*3/uL (ref 0.0–0.7)
HEMATOCRIT: 42.8 % (ref 36.0–46.0)
Hemoglobin: 15.3 g/dL — ABNORMAL HIGH (ref 12.0–15.0)
Lymphocytes Relative: 17 % (ref 12–46)
Lymphs Abs: 1.4 10*3/uL (ref 0.7–4.0)
MCH: 31 pg (ref 26.0–34.0)
MCHC: 35.7 g/dL (ref 30.0–36.0)
MCV: 86.6 fL (ref 78.0–100.0)
MONO ABS: 0.5 10*3/uL (ref 0.1–1.0)
Monocytes Relative: 6 % (ref 3–12)
Neutro Abs: 6.2 10*3/uL (ref 1.7–7.7)
Neutrophils Relative %: 76 % (ref 43–77)
Platelets: 290 10*3/uL (ref 150–400)
RBC: 4.94 MIL/uL (ref 3.87–5.11)
RDW: 12.4 % (ref 11.5–15.5)
WBC: 8.1 10*3/uL (ref 4.0–10.5)

## 2013-05-19 LAB — I-STAT TROPONIN, ED: TROPONIN I, POC: 0 ng/mL (ref 0.00–0.08)

## 2013-05-19 LAB — COMPREHENSIVE METABOLIC PANEL
ALBUMIN: 4.2 g/dL (ref 3.5–5.2)
ALT: 14 U/L (ref 0–35)
AST: 16 U/L (ref 0–37)
Alkaline Phosphatase: 76 U/L (ref 39–117)
BUN: 15 mg/dL (ref 6–23)
CALCIUM: 10.2 mg/dL (ref 8.4–10.5)
CO2: 25 meq/L (ref 19–32)
CREATININE: 0.66 mg/dL (ref 0.50–1.10)
Chloride: 98 mEq/L (ref 96–112)
GFR calc Af Amer: >90 mL/min (ref >90)
Glucose, Bld: 147 mg/dL — ABNORMAL HIGH (ref 70–99)
Potassium: 4 mEq/L (ref 3.7–5.3)
Sodium: 138 mEq/L (ref 137–147)
Total Bilirubin: 0.4 mg/dL (ref 0.3–1.2)
Total Protein: 7.9 g/dL (ref 6.0–8.3)

## 2013-05-19 LAB — LIPASE, BLOOD: Lipase: 35 U/L (ref 11–59)

## 2013-05-19 MED ORDER — TRAMADOL HCL 50 MG PO TABS: 50.0000 mg | ORAL_TABLET | Freq: Four times a day (QID) | ORAL | Status: DC | PRN

## 2013-05-19 MED ORDER — FAMOTIDINE 20 MG PO TABS: 20.0000 mg | ORAL_TABLET | Freq: Two times a day (BID) | ORAL | Status: DC

## 2013-05-19 MED ORDER — SODIUM CHLORIDE 0.9 % IV BOLUS (SEPSIS)
1000.0000 mL | Freq: Once | INTRAVENOUS | Status: AC
  Administered 2013-05-19: 1000 mL via INTRAVENOUS

## 2013-05-19 MED ORDER — IOHEXOL 300 MG/ML  SOLN
100.0000 mL | Freq: Once | INTRAMUSCULAR | Status: AC | PRN
  Administered 2013-05-19: 100 mL via INTRAVENOUS

## 2013-05-19 MED ORDER — OMEPRAZOLE 20 MG PO CPDR: 20.0000 mg | DELAYED_RELEASE_CAPSULE | Freq: Every day | ORAL | Status: DC

## 2013-05-19 MED ORDER — POLYETHYLENE GLYCOL 3350 17 GM/SCOOP PO POWD: 17.0000 g | Freq: Every day | ORAL | Status: DC

## 2013-05-19 MED ORDER — IOHEXOL 300 MG/ML  SOLN
25.0000 mL | Freq: Once | INTRAMUSCULAR | Status: AC | PRN
Start: 2013-05-19 — End: 2013-05-19
  Administered 2013-05-19: 25 mL via ORAL

## 2013-05-19 MED ORDER — METOCLOPRAMIDE HCL 5 MG/ML IJ SOLN
10.0000 mg | Freq: Once | INTRAMUSCULAR | Status: AC
Start: 2013-05-19 — End: 2013-05-19
  Administered 2013-05-19: 10 mg via INTRAVENOUS
  Filled 2013-05-19: qty 2

## 2013-05-19 MED ORDER — KETOROLAC TROMETHAMINE 30 MG/ML IJ SOLN
30.0000 mg | Freq: Once | INTRAMUSCULAR | Status: AC
  Administered 2013-05-19: 30 mg via INTRAVENOUS
  Filled 2013-05-19: qty 1

## 2013-05-19 MED ORDER — MORPHINE SULFATE 4 MG/ML IJ SOLN
4.0000 mg | Freq: Once | INTRAMUSCULAR | Status: AC
  Administered 2013-05-19: 4 mg via INTRAVENOUS
  Filled 2013-05-19: qty 1

## 2013-05-19 MED ORDER — ONDANSETRON 4 MG PO TBDP
4.0000 mg | ORAL_TABLET | Freq: Once | ORAL | Status: AC
  Administered 2013-05-19: 4 mg via ORAL
  Filled 2013-05-19: qty 1

## 2013-05-19 NOTE — ED Notes (Signed)
Pt was recently seen at PCP for abd pain and had gotten an u/s. unk results but brought cd with her today. Has been having lower abd and lower back pain as well as upper abd pain for a while. Had her gallbladder removed last august. Has felt weak all over and been depressed for awhile. Stays nauseated but has had no vomiting.

## 2013-05-19 NOTE — ED Provider Notes (Signed)
Medical screening examination/treatment/procedure(s) were performed by non-physician practitioner and as supervising physician I was immediately available for consultation/collaboration.   EKG Interpretation   Date/Time:  Wednesday May 19 2013 13:17:36 EDT Ventricular Rate:  95 PR Interval:  144 QRS Duration: 84 QT Interval:  326 QTC Calculation: 409 R Axis:   77 Text Interpretation:  Normal sinus rhythm T wave abnormality, consider  inferior ischemia T wave abnormality, consider anterior ischemia Abnormal  ECG No significant change since last tracing Confirmed by YAO  MD, DAVID  ([id number]) on 05/19/2013 8:03:27 PM        Richardean Canalavid H Yao, MD 05/19/13 2101

## 2013-05-19 NOTE — Discharge Instructions (Signed)
Your lab testing and CT scan of your abdomen and pelvis have not shown any signs for a concerning or in emergent condition to explain your pain. Your CT scan does show signs of constipation. Please use a stool softener to help with your symptoms and followup with a primary care provider or a GI specialist for continued evaluation. It is recommended that you have a liquid or soft diet for the next 2-3 days and increase your diet slowly.    Dolor abdominal en adultos (Abdominal Pain, Adult) El dolor de estmago (abdominal) puede tener muchas causas. La mayora de las veces, el dolor de [place]mago no es peligroso. Muchos de Franklin Resourcesestos casos de dolor de estmago pueden controlarse y tratarse en casa. CUIDADOS EN EL HOGAR   No tome medicamentos que lo ayuden a defecar (laxantes), salvo que su mdico se lo indique.  Solo tome los medicamentos que le haya indicado su mdico.  Coma o beba lo que le indique su mdico. Su mdico le dir si debe seguir una dieta especial. SOLICITE AYUDA SI:  No sabe cul es la causa del dolor de [place]ago.  Tiene dolor de estmago cuando siente ganas de vomitar (nuseas) o tiene colitis (diarrea).  Tiene dolor durante la miccin o la evacuacin.  El dolor de estmago lo despierta de noche.  Tiene dolor de Mirantestmago que empeora o Westminstermejora cuando come.  Tiene dolor de Mirantestmago que empeora cuando come CIGNAalimentos grasosos. SOLICITE AYUDA DE INMEDIATO SI:   El dolor no desaparece en un plazo mximo de 2horas.  Tiene fiebre.  No deja de (vomitar).  El dolor cambia y se Librarian, academiclocaliza solo en la parte derecha o izquierda del Isabelestmago.  La materia fecal es sanguinolenta o de aspecto alquitranado. ASEGRESE DE QUE:   Comprende estas instrucciones.  Controlar su afeccin.  Recibir ayuda de inmediato si no mejora o si empeora. Document Released: 05/24/2008 Document Revised: 12/16/2012 [facility] Patient Information 2014 [place]Care, [place].    Constipacin (Constipation) Se  llama constipacin cuando:   Elimina heces (mueve el intestino) menos de 3 veces por semana.  Tiene dificultad para mover el intestino.  Las heces son secas y duras o son ms grandes que lo normal. CUIDADOS EN EL HOGAR   Consuma ms fibra que se encuentra en frutas, verduras y granos enteros como arroz integral y frijoles.  Consuma menos alimentos ricos en grasas y azcar. Estos incluyen patatas fritas, hamburguesas, galletas, dulces y refrescos.  Si no consume suficientes alimentos ricos en fibras, tome productos que tengan agregado de fibra (suplementos).  Beba gran cantidad de lquido para mantener el pis (orina) de tono claro o amarillo plido.  Vaya al bao cuando sienta la necesidad de ir. No espere.  Slo debe tomar los Monsanto Companymedicamentos como se los han recetado. No tome medicamentos que le ayuden a Licensed conveyancermover el intestino (laxantes) sin antes consultarlo con su mdico.  Haga ejercicio en forma regular, o como lo indique su mdico. SOLICITE AYUDA DE INMEDIATO SI:   Observa sangre brillante en las heces (materia fecal).  El estreimiento dura ms de 4 das o Cantonempeora.  Siente dolor en el vientre (abdomen) o en el ano (recto).  Las heces son delgadas (como un lpiz).  Pierde peso de Noonanmanera inexplicable. ASEGRESE DE QUE:   Comprende estas instrucciones.  Controlar su enfermedad.  Solicitar ayuda de inmediato si no mejora o si empeora. Document Released: 03/30/2010 Document Revised: 05/20/2011 [facility] Patient Information 2014 [place]iatExitCare, MarylandLLC.

## 2013-05-19 NOTE — ED Provider Notes (Signed)
CSN: [id number]     Arrival date & time 05/19/13  1308 History   First MD Initiated Contact with Patient 05/19/13 1657     Chief Complaint  Patient presents with  . Abdominal Pain  . Nausea     (Consider location/radiation/quality/duration/timing/severity/associated sxs/prior Treatment) HPI Patient presents to the emergency department with lower abdominal pain, that has been present over the last week.  The patient, states she was seen by her primary care Dr. Who did an ultrasound but did not get the results.  Patient denies vomiting, chest pain, shortness of breath, headache, blurred vision, weakness, dizziness, fever, cough, dysuria, vaginal bleeding, vaginal discharge, rash or syncope.  She, states she's had some slight nausea.  He states she did not take any medications prior to arrival other than Advil, which minimally helped.  Her pain.  Patient had a cholecystectomy back in August of 2014  History reviewed. No pertinent past medical history. Past Surgical History  Procedure Laterality Date  . Cholecystectomy N/A 10/23/2012    Procedure: LAPAROSCOPIC CHOLECYSTECTOMY WITH INTRAOPERATIVE CHOLANGIOGRAM;  Surgeon: Currie Parishristian J Streck, MD;  Location: [facility] OR;  Service: General;  Laterality: N/A;   No family history on file. History  Substance Use Topics  . Smoking status: Never Smoker   . Smokeless tobacco: Never Used  . Alcohol Use: No   OB History   Grav Para Term Preterm Abortions TAB SAB Ect Mult Living                 Review of Systems  All other systems negative except as documented in the HPI. All pertinent positives and negatives as reviewed in the HPI. Allergies  Shrimp  Home Medications   Current Outpatient Rx  Name  Route  Sig  Dispense  Refill  . ibuprofen (ADVIL,MOTRIN) 200 MG tablet   Oral   Take 400-600 mg by mouth daily as needed for pain. Usually takes 2 tablets (400 mg), 3 tablets (600 mg) for severe pain          BP 127/79  Pulse 78  Temp(Src) 98.5 F  (36.9 C) (Oral)  Resp 18  SpO2 100% Physical Exam  Nursing note and vitals reviewed. Constitutional: She is oriented to person, place, and time. She appears well-developed and well-nourished. No distress.  HENT:  Head: Normocephalic and atraumatic.  Mouth/Throat: Oropharynx is clear and moist.  Eyes: Pupils are equal, round, and reactive to light.  Cardiovascular: Normal rate, regular rhythm and normal heart sounds.  Exam reveals no gallop and no friction rub.   No murmur heard. Pulmonary/Chest: Effort normal and breath sounds normal. No respiratory distress.  Abdominal: Soft. Normal appearance and bowel sounds are normal. She exhibits no distension. There is tenderness. There is no rigidity, no rebound and no guarding.    Neurological: She is alert and oriented to person, place, and time. She exhibits normal muscle tone. Coordination normal.  Skin: Skin is warm and dry. No rash noted. No erythema.    ED Course  Procedures (including critical care time) Labs Review Labs Reviewed  CBC WITH DIFFERENTIAL - Abnormal; Notable for the following:    Hemoglobin 15.3 (*)    All other components within normal limits  COMPREHENSIVE METABOLIC PANEL - Abnormal; Notable for the following:    Glucose, Bld 147 (*)    All other components within normal limits  LIPASE, BLOOD  URINALYSIS, ROUTINE W REFLEX MICROSCOPIC  I-STAT TROPOININ, ED   Will obtain a CT scan of the patient's abdomen pelvis.  Patient is given pain medications.  Will further decide the course of action when her CT scan results are back.  All questions were answered.  The patient.  Voices an understanding of the plan thus far    Carlyle Dolly, PA-C 05/19/13 1935

## 2013-05-19 NOTE — ED Provider Notes (Signed)
Shannon Eaton S 8:00 PM patient discussed in sign out. Patient with diffuse mild abdominal pains for several days. Labs have been unremarkable. No significant concerns on exam. CT scan ordered for further evaluation.   8:45 PM CT scan unremarkable. There is stool throughout the colon and distal small intestine. No signs of small bowel obstruction however. At this time we'll recommend stool softener and pain medication. Findings discussed with patient and family. We will also plan to give GI followup if symptoms not improving with treatment. She agrees with plan.  Angus Sellereter S Beckett Maden, PA-C 05/19/13 2325

## 2013-05-20 NOTE — ED Provider Notes (Signed)
Shannon Eaton 12:43 AM I agree with plan.  Enid SkeensJoshua Eaton Shannon Coin, MD 05/20/13 [phone]

## 2013-05-21 ENCOUNTER — Ambulatory Visit: Payer: No Typology Code available for payment source | Attending: Internal Medicine

## 2013-06-06 ENCOUNTER — Emergency Department (HOSPITAL_COMMUNITY)
Admission: EM | Admit: 2013-06-06 | Discharge: 2013-06-06 | Disposition: A | Discharge status: Home / Self Care | Payer: No Typology Code available for payment source | Source: Home / Self Care | Attending: Family Medicine | Admitting: Family Medicine

## 2013-06-06 ENCOUNTER — Other Ambulatory Visit (HOSPITAL_COMMUNITY)
Admission: RE | Admit: 2013-06-06 | Discharge: 2013-06-06 | Disposition: A | Discharge status: Home / Self Care | Payer: No Typology Code available for payment source | Source: Ambulatory Visit | Attending: Family Medicine | Admitting: Family Medicine

## 2013-06-06 ENCOUNTER — Encounter (HOSPITAL_COMMUNITY): Payer: Self-pay | Admitting: Emergency Medicine

## 2013-06-06 DIAGNOSIS — N760 Acute vaginitis: Secondary | ICD-10-CM | POA: Insufficient documentation

## 2013-06-06 DIAGNOSIS — R102 Pelvic and perineal pain: Secondary | ICD-10-CM

## 2013-06-06 DIAGNOSIS — N949 Unspecified condition associated with female genital organs and menstrual cycle: Secondary | ICD-10-CM

## 2013-06-06 DIAGNOSIS — Z113 Encounter for screening for infections with a predominantly sexual mode of transmission: Secondary | ICD-10-CM | POA: Insufficient documentation

## 2013-06-06 LAB — POCT URINALYSIS DIP (DEVICE)
BILIRUBIN URINE: NEGATIVE
Glucose, UA: NEGATIVE mg/dL
Ketones, ur: NEGATIVE mg/dL
LEUKOCYTES UA: NEGATIVE
Nitrite: NEGATIVE
PH: 7 (ref 5.0–8.0)
Protein, ur: NEGATIVE mg/dL
Specific Gravity, Urine: 1.015 (ref 1.005–1.030)
Urobilinogen, UA: 0.2 mg/dL (ref 0.0–1.0)

## 2013-06-06 LAB — POCT PREGNANCY, URINE: Preg Test, Ur: NEGATIVE

## 2013-06-06 LAB — POCT H PYLORI SCREEN: H. PYLORI SCREEN, POC: NEGATIVE

## 2013-06-06 MED ORDER — GI COCKTAIL ~~LOC~~
ORAL | Status: AC
  Filled 2013-06-06: qty 30

## 2013-06-06 MED ORDER — GI COCKTAIL ~~LOC~~
30.0000 mL | Freq: Once | ORAL | Status: AC
  Administered 2013-06-06: 30 mL via ORAL

## 2013-06-06 NOTE — ED Provider Notes (Signed)
CSN: [id number]     Arrival date & time 06/06/13  1130 History   First MD Initiated Contact with Patient 06/06/13 1304     Chief Complaint  Patient presents with  . Abdominal Pain   (Consider location/radiation/quality/duration/timing/severity/associated sxs/prior Treatment) HPI Comments: [facility] pelvic discomfort that radiates to her lower back. She states that this back discomfort then radiates up the middle of her back to level of thoracic spine. States areas of discomfort feel "hot." Has been seen by her PCP for this issue one month ago and underwent pelvic ultrasound that was reports by patient to have been normal. [facility]. Underwent laboratory testing and CT A/P. Records reviewed and studies were all normal with the exception of mild constipation. Was treated with tramadol and miralax. Has been using these medications as prescribed and states she is still having symptoms, primarily at night.  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 The history is limited by a language barrier. A language interpreter was used.    History reviewed. No pertinent past medical history. Past Surgical History  Procedure Laterality Date  . Cholecystectomy N/A 10/23/2012    Procedure: LAPAROSCOPIC CHOLECYSTECTOMY WITH INTRAOPERATIVE CHOLANGIOGRAM;  Surgeon: Currie Parishristian J Streck, MD;  Location: [facility] OR;  Service: General;  Laterality: N/A;   History reviewed. No pertinent family history. History  Substance Use Topics  . Smoking status: Never Smoker   . Smokeless tobacco: Never Used  . Alcohol Use: No   OB History   Grav Para Term Preterm Abortions TAB SAB Ect Mult Living                 Review of Systems  Constitutional: Negative.   HENT: Negative.   Eyes: Negative.   Respiratory: Negative.   Cardiovascular: Negative.    Gastrointestinal: Positive for nausea and abdominal pain. Negative for vomiting, diarrhea, constipation, blood in stool, anal bleeding and rectal pain.  Endocrine: Negative for polydipsia, polyphagia and polyuria.  Genitourinary: Positive for pelvic pain. Negative for dysuria, urgency, frequency, hematuria, flank pain, decreased urine volume, vaginal bleeding, vaginal discharge, difficulty urinating, genital sores, vaginal pain and menstrual problem.  Musculoskeletal: Positive for back pain.  Skin: Negative.   Neurological: Negative.     Allergies  Shrimp  Home Medications   Current Outpatient Rx  Name  Route  Sig  Dispense  Refill  . famotidine (PEPCID) 20 MG tablet   Oral   Take 1 tablet (20 mg total) by mouth 2 (two) times daily.   30 tablet   0   . ibuprofen (ADVIL,MOTRIN) 200 MG tablet   Oral   Take 400-600 mg by mouth daily as needed for pain. Usually takes 2 tablets (400 mg), 3 tablets (600 mg) for severe pain         . omeprazole (PRILOSEC) 20 MG capsule   Oral   Take 1 capsule (20 mg total) by mouth daily.   30 capsule   0   . polyethylene glycol powder (GLYCOLAX/MIRALAX) powder   Oral   Take 17 g by mouth daily.   255 g   0   . traMADol (ULTRAM) 50 MG tablet   Oral   Take 1 tablet (50 mg total) by mouth every 6 (six) hours as needed.   15 tablet   0  BP 133/85  Pulse 75  Temp(Src) 98.6 F (37 C) (Oral)  Resp 18  SpO2 96% Physical Exam  Nursing note and vitals reviewed. Constitutional: She is oriented to person, place, and time. She appears well-developed and well-nourished. No distress.  HENT:  Head: Normocephalic and atraumatic.  Eyes: Conjunctivae are normal. No scleral icterus.  Neck: Normal range of motion. Neck supple.  Cardiovascular: Normal rate, regular rhythm and normal heart sounds.   Pulmonary/Chest: Effort normal and breath sounds normal.  Abdominal: Soft. Bowel sounds are normal. She exhibits no distension and no mass. There is  tenderness in the epigastric area. There is no rebound and no guarding. Hernia confirmed negative in the right inguinal area and confirmed negative in the left inguinal area.  See GYN exam  Genitourinary: Vagina normal. Pelvic exam was performed with patient supine. There is no rash, tenderness or lesion on the right labia. There is no rash, tenderness or lesion on the left labia. Uterus is not deviated, not enlarged, not fixed and not tender. Cervix exhibits no motion tenderness, no discharge and no friability. Right adnexum displays tenderness. Right adnexum displays no mass and no fullness. Left adnexum displays tenderness. Left adnexum displays no mass and no fullness. No erythema, tenderness or bleeding around the vagina. No foreign body around the vagina. No signs of injury around the vagina. No vaginal discharge found.  Musculoskeletal: Normal range of motion. She exhibits no edema and no tenderness.  Lymphadenopathy:       Right: No inguinal adenopathy present.       Left: No inguinal adenopathy present.  Neurological: She is alert and oriented to person, place, and time.  Skin: Skin is warm and dry. No rash noted.  Psychiatric: She has a normal mood and affect. Her behavior is normal.    ED Course  Procedures (including critical care time) Labs Review Labs Reviewed  POCT URINALYSIS DIP (DEVICE) - Abnormal; Notable for the following:    Hgb urine dipstick SMALL (*)    All other components within normal limits  POCT PREGNANCY, URINE  POCT H PYLORI SCREEN  CERVICOVAGINAL ANCILLARY ONLY   Imaging Review No results found.   MDM   1. Pelvic pain    H. Pylori negative. UPT negative. UA normal. No indication of acute abdominal process requiring additional testing or imaging. Encouraged patient to follow up with either her PCP or OB.GYN for additional evaluation should her symptoms persist. May continue to use medications as previously prescribed.   Jess Barters [place], [place] 06/06/13  [phone]

## 2013-06-06 NOTE — ED Notes (Signed)
C/o abd pain and lower back pain States she is nausea States pain went away but comes back States she was seen in ER for the same condition Tramadol and miralax was recommend as tx

## 2013-06-06 NOTE — Discharge Instructions (Signed)
Your blood and urine studies [facility] were normal. I would advise you to continue taking medications as they have been prescribed and contacting your primary care doctor at the [facility] and [facility] for further evaluation if symptoms do not improve. You may use tylenol as directed on packaging for your discomfort.  Dolor abdominal en adultos (Abdominal Pain, Adult) El dolor puede tener muchas causas. Normalmente la causa del dolor abdominal no es una enfermedad y Scientist, clinical (histocompatibility and immunogenetics) sin TEFL teacher. Frecuentemente puede controlarse y tratarse en casa. Su mdico le Medical sales representative examen fsico y posiblemente solicite anlisis de sangre y radiografas para ayudar a Chief Strategy Officer la gravedad de su dolor. Sin embargo, en [employer], debe transcurrir ms tiempo antes de que se pueda Clinical research associate una causa evidente del dolor. Antes de llegar a ese punto, es posible que su mdico no sepa si necesita ms pruebas o un tratamiento ms profundo. INSTRUCCIONES PARA EL CUIDADO EN EL HOGAR  Est atento al dolor para ver si hay cambios. Las siguientes indicaciones ayudarn a Architectural technologist que pueda sentir:  [place] solo medicamentos de venta libre o recetados, segn las indicaciones del mdico.  No tome laxantes a menos que se lo haya indicado su mdico.  Pruebe con Neomia Dear dieta lquida absoluta (caldo, t o agua) segn se lo indique su mdico. Introduzca gradualmente una dieta normal, segn su tolerancia. SOLICITE ATENCIN MDICA SI:  Tiene dolor abdominal sin explicacin.  Tiene dolor abdominal relacionado con nuseas o diarrea.  Tiene dolor cuando orina o defeca.  Experimenta dolor abdominal que lo despierta de noche.  Tiene dolor abdominal que empeora o mejora cuando come alimentos.  Tiene dolor abdominal que empeora cuando come alimentos grasosos. SOLICITE ATENCIN MDICA DE INMEDIATO SI:   El dolor no desaparece en un plazo mximo de 2horas.  Tiene fiebre.  No deja  de (vomitar).  El Engineer, mining se siente solo en partes del abdomen, como el lado derecho o la parte inferior izquierda del abdomen.  Evaca materia fecal sanguinolenta o negra, de aspecto alquitranado. ASEGRESE DE QUE:  Comprende estas instrucciones.  Controlar su afeccin.  Recibir ayuda de inmediato si no mejora o si empeora. Document Released: 02/25/2005 Document Revised: 12/16/2012 [facility] Patient Information 2014 [place], [place].  Dolor abdominal en las mujeres (Abdominal Pain, Women) El dolor abdominal (en el estmago, la pelvis o el vientre) puede tener muchas causas. Es importante que le informe a su mdico:  La ubicacin del Engineer, mining.  Viene y se va, o persiste todo el tiempo?  Hay situaciones que Location manager (comer ciertos alimentos, la actividad fsica)?  Tiene otros sntomas asociados al dolor (fiebre, nuseas, vmitos, diarrea)? Todo es de gran ayuda cuando se trata de hallar la causa del dolor. CAUSAS  Estmago: Infecciones por virus o bacterias, o lcera.  Intestino: Apendicitis (apndice inflamado), ileitis regional (enfermedad de Crohn), colitis ulcerosa (colon inflamado), sndrome del colon irritable, diverticulitis (inflamacin de los divertculos del colon) o cncer de estmago oo intestino.  Enfermedades de la vescula biliar o clculos.  Enfermedades renales, clculos o infecciones en el rin.  Infeccin o cncer del pncreas.  Fibromialgia (trastorno doloroso)  Enfermedades de los rganos femeninos:  Uterus: tero: fibroma (tumor no canceroso) o infeccin  Trompas de [place]op[place]: infeccin o embarazo ectpico  En los ovarios, quistes o tumores.  Adherencias plvicas (tejido cicatrizal).  Endometriosis (el tejido que cubre el tero se desarrolla en la pelvis y los rganos plvicos).  Sndrome de congestin plvica (los rganos femeninos  se llenan de sangre antes del periodo menstrual(  Dolor durante el periodo menstrual.  Dolor durante la  ovulacin (al producir vulos).  Dolor al usar el DIU (dispositivo intrauterino para el control de la natalidad)  Psychologist, clinicalCncer en los rganos femeninos.  Dolor funcional (no est originado en una enfermedad, puede mejorar sin tratamiento).  Dolor de origen psicolgico  Depresin. DIAGNSTICO Su mdico decidir la gravedad del dolor a travs del examen fsico  Anlisis de sangre  Radiografas  Ecografas  TC (tomografa computada, tipo especial de radiografas).  IMR (resonancia magntica)  Cultivos, en el caso una infeccin  Colon por enema de bario (se inserta una sustancia de contraste en el intestino grueso para mejorar la observacin con rayos X.)  Colonoscopa (observacin del intestino con un tubo luminoso).  Laparoscopa (examen del interior del abdomen con un tubo que tiene Intel Corporationuna luz).  Ciruga exploratoria abdominal mayor (se observa el abdomen realizando una gran incisin). TRATAMIENTO El tratamiento depender de la causa del problema.   Muchos de estos casos pueden controlarse y tratarse en casa.  Medicamentos de venta libre indicados por el mdico.  Medicamentos con receta.  Antibiticos, en caso de infeccin  Pldoras anticonceptivas, en el caso de perodos dolorosos o dolor al ovular.  Tratamiento hormonal, para la endometriosis  Inyecciones para bloqueo nervioso selectivo.  Fisioterapia.  Antidepresivos.  Consejos por parte de un psclogo o psiquiatra.  Ciruga mayor o menor. INSTRUCCIONES PARA EL CUIDADO DOMICILIARIO  No tome ni administre laxantes a menos que se lo haya indicado su mdico.  Tome analgsicos de venta libre slo si se lo ha indicado el profesional que lo asiste. No tome aspirina, ya que puede causar Apple Computermolestias en el estmago o hemorragias.  Consuma una dieta lquida (caldo o agua) segn lo indicado por el mdico. Progrese lentamente a una dieta blanda, segn la tolerancia, si el dolor se relaciona con el estmago o el  intestino.  Tenga un termmetro y tmese la temperatura varias veces al da.  Haga reposo en la cama y [place] Creek[place]erma, si esto Research scientist (life sciences)alivia el dolor.  Evite las relaciones sexuales, Counsellorsi le producen dolor.  Evite las situaciones estresantes.  Cumpla con las visitas y los anlisis de control, segn las indicaciones de su mdico.  Si el dolor no se [place] con los medicamentos o la [place], [place]uede tratar con:  Acupuntura.  Ejercicios de relajacin (yoga, meditacin).  Terapia grupal.  Psicoterapia. SOLICITE ATENCIN MDICA SI:  Nota que ciertos Pharmacist, communityalimentos le producen dolor de [place]mago.  El tratamiento indicado para Arboriculturistrealizar en el hogar no Marketing executivele alivia el dolor.  Necesita analgsicos ms fuertes.  Quiere que le retiren el DIU.  Si se siente confundido o desfalleciente.  Presenta nuseas o vmitos.  Aparece una erupcin cutnea.  Sufre efectos adversos o una reaccin alrgica debido a los medicamentos que toma. SOLICITE ATENCIN MDICA DE INMEDIATO SI:  El dolor persiste o se agrava.  Tiene fiebre.  Siente el dolor slo en algunos sectores del abdomen. Si se localiza en la zona derecha, posiblemente podra tratarse de apendicitis. En un adulto, si se localiza en la regin inferior izquierda del abdomen, podra tratarse de colitis o diverticulitis.  Hay sangre en las heces (deposiciones de color rojo brillante o negro alquitranado), con o sin vmitos.  Usted presenta sangre en la orina.  Siente escalofros con o sin fiebre.  Se desmaya. ASEGRESE QUE:   Comprende estas instrucciones.  Controlar su enfermedad.  Solicitar ayuda de inmediato si no mejora o si empeora. Document  Released: 06/13/2008 Document Revised: 05/20/2011 [employer] Patient Information 2014 [place], [place].

## 2013-06-07 LAB — CERVICOVAGINAL ANCILLARY ONLY
Chlamydia: NEGATIVE
NEISSERIA GONORRHEA: NEGATIVE
WET PREP (BD AFFIRM): NEGATIVE
Wet Prep (BD Affirm): NEGATIVE
Wet Prep (BD Affirm): NEGATIVE

## 2013-06-09 NOTE — ED Provider Notes (Signed)
Medical screening examination/treatment/procedure(s) were performed by a resident physician or non-physician practitioner and as the supervising physician I was immediately available for consultation/collaboration.  Tamiki Kuba, MD   Brandt Chaney S Deserai Cansler, MD 06/09/13 0748 

## 2013-06-22 ENCOUNTER — Ambulatory Visit: Payer: No Typology Code available for payment source | Attending: Internal Medicine | Admitting: Internal Medicine

## 2013-06-22 ENCOUNTER — Other Ambulatory Visit: Payer: Self-pay | Admitting: Emergency Medicine

## 2013-06-22 ENCOUNTER — Telehealth: Payer: Self-pay | Admitting: Internal Medicine

## 2013-06-22 VITALS — BP 149/85 | HR 86 | Temp 99.2°F | Resp 18 | Ht 64.17 in | Wt 148.0 lb

## 2013-06-22 DIAGNOSIS — R5381 Other malaise: Secondary | ICD-10-CM | POA: Insufficient documentation

## 2013-06-22 DIAGNOSIS — R5383 Other fatigue: Principal | ICD-10-CM

## 2013-06-22 LAB — COMPREHENSIVE METABOLIC PANEL
ALT: 17 U/L (ref 0–35)
AST: 19 U/L (ref 0–37)
Albumin: 4.4 g/dL (ref 3.5–5.2)
Alkaline Phosphatase: 61 U/L (ref 39–117)
BILIRUBIN TOTAL: 0.4 mg/dL (ref 0.2–1.2)
BUN: 15 mg/dL (ref 6–23)
CO2: 30 meq/L (ref 19–32)
CREATININE: 0.76 mg/dL (ref 0.50–1.10)
Calcium: 9.7 mg/dL (ref 8.4–10.5)
Chloride: 102 mEq/L (ref 96–112)
GLUCOSE: 98 mg/dL (ref 70–99)
Potassium: 3.9 mEq/L (ref 3.5–5.3)
Sodium: 138 mEq/L (ref 135–145)
Total Protein: 7.1 g/dL (ref 6.0–8.3)

## 2013-06-22 LAB — CBC WITH DIFFERENTIAL/PLATELET
BASOS PCT: 0 % (ref 0–1)
Basophils Absolute: 0 10*3/uL (ref 0.0–0.1)
Eosinophils Absolute: 0.1 10*3/uL (ref 0.0–0.7)
Eosinophils Relative: 3 % (ref 0–5)
HCT: 39.5 % (ref 36.0–46.0)
Hemoglobin: 14 g/dL (ref 12.0–15.0)
LYMPHS ABS: 1.3 10*3/uL (ref 0.7–4.0)
Lymphocytes Relative: 28 % (ref 12–46)
MCH: 29.7 pg (ref 26.0–34.0)
MCHC: 35.4 g/dL (ref 30.0–36.0)
MCV: 83.7 fL (ref 78.0–100.0)
Monocytes Absolute: 0.3 10*3/uL (ref 0.1–1.0)
Monocytes Relative: 7 % (ref 3–12)
NEUTROS ABS: 2.9 10*3/uL (ref 1.7–7.7)
NEUTROS PCT: 62 % (ref 43–77)
Platelets: 246 10*3/uL (ref 150–400)
RBC: 4.72 MIL/uL (ref 3.87–5.11)
RDW: 13.8 % (ref 11.5–15.5)
WBC: 4.7 10*3/uL (ref 4.0–10.5)

## 2013-06-22 LAB — HEMOGLOBIN A1C
HEMOGLOBIN A1C: 5.6 % (ref <5.7)
MEAN PLASMA GLUCOSE: 114 mg/dL (ref <117)

## 2013-06-22 LAB — LIPID PANEL
Cholesterol: 261 mg/dL — ABNORMAL HIGH (ref 0–200)
HDL: 57 mg/dL (ref >39)
LDL Cholesterol: 175 mg/dL — ABNORMAL HIGH (ref 0–99)
Total CHOL/HDL Ratio: 4.6 Ratio
Triglycerides: 143 mg/dL (ref <150)
VLDL: 29 mg/dL (ref 0–40)

## 2013-06-22 LAB — TSH: TSH: 1.001 u[IU]/mL (ref 0.350–4.500)

## 2013-06-22 MED ORDER — POLYVINYL ALCOHOL 1.4 % OP SOLN: 1.0000 [drp] | OPHTHALMIC | Status: DC | PRN

## 2013-06-22 NOTE — Progress Notes (Signed)
Pt is here w/Spanish interpreter Here to est care Would like to talk to PCP about menapause/HA/anxiety/GERD Alert w/no signs of acute distress.

## 2013-06-22 NOTE — Progress Notes (Signed)
Patient ID: Shannon LoaderSusana Eaton, female   DOB: 01/08/1961, 53 y.o.   MRN: [id number]   CC: Fatigue  HPI: Patient is 53 year old female presenting to clinic with main concern of progressively worsening fatigue over the past 2-3 years. She explains she was told she had menopause several years ago and was started on hormone replacement therapy. She felt better after taking the medicine by Dr. told her to stop taking after one year. She currently denies chest pain or shortness of breath, no abdominal or urinary concerns, no specific focal neurological symptoms.  Allergies  Allergen Reactions  . Shrimp [Shellfish Allergy] Other (See Comments)    Shrimp ceviche causes tongues and gums to swell   History reviewed. No pertinent past medical history. Current Outpatient Prescriptions on File Prior to Visit  Medication Sig Dispense Refill  . famotidine (PEPCID) 20 MG tablet Take 1 tablet (20 mg total) by mouth 2 (two) times daily.  30 tablet  0  . ibuprofen (ADVIL,MOTRIN) 200 MG tablet Take 400-600 mg by mouth daily as needed for pain. Usually takes 2 tablets (400 mg), 3 tablets (600 mg) for severe pain      . polyethylene glycol powder (GLYCOLAX/MIRALAX) powder Take 17 g by mouth daily.  255 g  0  . omeprazole (PRILOSEC) 20 MG capsule Take 1 capsule (20 mg total) by mouth daily.  30 capsule  0  . traMADol (ULTRAM) 50 MG tablet Take 1 tablet (50 mg total) by mouth every 6 (six) hours as needed.  15 tablet  0   No current facility-administered medications on file prior to visit.   No known family medical history History   Social History  . Marital Status: Married    Spouse Name: N/A    Number of Children: N/A  . Years of Education: N/A   Occupational History  . Not on file.   Social History Main Topics  . Smoking status: Never Smoker   . Smokeless tobacco: Never Used  . Alcohol Use: No  . Drug Use: No  . Sexual Activity: Not on file   Other Topics Concern  . Not on file   Social  History Narrative  . No narrative on file    Review of Systems  Constitutional: Negative for fever, chills, diaphoresis.  HENT: Negative for ear pain, nosebleeds, congestion, facial swelling, rhinorrhea, neck pain, neck stiffness and ear discharge.   Eyes: Negative for pain, discharge, redness, itching and visual disturbance.  Respiratory: Negative for cough, choking, chest tightness, shortness of breath, wheezing and stridor.   Cardiovascular: Negative for chest pain, palpitations and leg swelling.  Gastrointestinal: Negative for abdominal distention.  Genitourinary: Negative for dysuria, urgency, frequency, hematuria, flank pain, decreased urine volume, difficulty urinating and dyspareunia.  Musculoskeletal: Negative for back pain, joint swelling, arthralgias and gait problem.  Neurological: Negative for dizziness, tremors, seizures, syncope, facial asymmetry, speech difficulty, weakness, light-headedness, numbness and headaches.  Hematological: Negative for adenopathy. Does not bruise/bleed easily.  Psychiatric/Behavioral: Negative for hallucinations, behavioral problems, confusion, dysphoric mood, decreased concentration and agitation.    Objective:   Filed Vitals:   06/22/13 0916  BP: 149/85  Pulse: 86  Temp: 99.2 F (37.3 C)  Resp: 18    Physical Exam  Constitutional: Appears well-developed and well-nourished. No distress.  HENT: Normocephalic. External right and left ear normal. Oropharynx is clear and moist.  Eyes: Conjunctivae and EOM are normal. PERRLA, no scleral icterus.  Neck: Normal ROM. Neck supple. No JVD. No tracheal deviation. No thyromegaly.  CVS:  RRR, S1/S2 +, no murmurs, no gallops, no carotid bruit.  Pulmonary: Effort and breath sounds normal, no stridor, rhonchi, wheezes, rales.  Abdominal: Soft. BS +,  no distension, tenderness, rebound or guarding.  Musculoskeletal: Normal range of motion. No edema and no tenderness.  Lymphadenopathy: No lymphadenopathy  noted, cervical, inguinal. Neuro: Alert. Normal reflexes, muscle tone coordination. No cranial nerve deficit. Skin: Skin is warm and dry. No rash noted. Not diaphoretic. No erythema. No pallor.  Psychiatric: Normal mood and affect. Behavior, judgment, thought content normal.   Lab Results  Component Value Date   WBC 8.1 05/19/2013   HGB 15.3* 05/19/2013   HCT 42.8 05/19/2013   MCV 86.6 05/19/2013   PLT 290 05/19/2013   Lab Results  Component Value Date   CREATININE 0.66 05/19/2013   BUN 15 05/19/2013   NA 138 05/19/2013   K 4.0 05/19/2013   CL 98 05/19/2013   CO2 25 05/19/2013    No results found for this basename: HGBA1C   Lipid Panel  No results found for this basename: chol, trig, hdl, cholhdl, vldl, ldlcalc       Assessment and plan:   Patient Active Problem List   Diagnosis Date Noted  .  fatigue  10/22/2012   - Unclear etiology, will check thyroid stimulating hormone level, cemented, CBC - Discussed dietary recommendations, daily vitamin obtained from fresh fruits and vegetables - Also discussed regular exercise 15-20 minutes per day - Followup of blood test

## 2013-06-22 NOTE — Patient Instructions (Signed)
Alergias (Allergies) El profesional que lo asiste le ha diagnosticado que usted padece de una alergia. Las alergias pueden ser ocasionadas por cualquier cosa a la que su organismo es sensible. Pueden ser alimentos, medicamentos, polen, sustancias qumicas y casi cualquiera de las cosas que lo rodean en su vida diaria que producen alrgenos. Un alrgeno es todo lo que hace que una sustancia produzca alergia. La herencia es uno de los factores que causa este problema. Esto significa que usted puede sufrir alguna de las alergias que sufrieron sus padres. Las alergias a la comida pueden ocurrir a cualquier edad. Estn entre las ms graves y pueden poner en peligro la vida. Algunos de los alimentos que comnmente producen alergia son la leche de vaca, los frutos de mar, los huevos, los frutos secos, el trigo y la soja. SNTOMAS  Hinchazn alrededor de la boca.  Una erupcin roja que produce picazn o urticaria.  Vmitos o diarrea.  Dificultad para respirar. LAS REACCIONES ALRGICAS GRAVES PONEN EN PELIGRO LA VIDA . Esta reaccin se denomina anafilaxis. Puede ocasionar que la boca y la garganta se hinchen y produzca dificultad para respirar y tragar. En reacciones graves, slo una pequea cantidad del alimento (por ejemplo, aceite de cacahuate en la ensalada) puede producir la muerte en pocos segundos. Las alergias estacionales pueden ocurrir a cualquier edad. Se denominan as porque generalmente se producen durante la misma estacin todos los aos. Puede ser una reaccin al moho, al polen del csped o al polen de los rboles. Otras causas del problema son los alrgenos que contienen los caros del polvo del hogar, el pelaje de las mascotas y las esporas del moho. Los sntomas consisten en congestin nasal, picazn y secrecin nasal asociada con estornudos, y lagrimeo y picazn en los ojos. Tambin puede haber picazn de la boca y los odos. Estos problemas aparecen cuando se entra en contacto con el polen  y otros alrgenos. Los alrgenos son las partculas que estn en el aire y a las que el organismo reacciona cuando existe una reaccin alrgica. Esto hace que usted libere anticuerpos alrgicos. A travs de una cadena de eventos, estos finalmente hacen que usted libere histamina en la corriente sangunea. Aunque esto implica una proteccin para su organismo, es lo que le produce disconfort. Ese es el motivo por el que se le han indicado antihistamnicos para sentirse mejor. Si usted no puede determinar cul es el alrgeno que le produjo la reaccin, puede someterse a una prueba de sangre o de piel. Las alergias no pueden curarse pero pueden controlarse con medicamentos. La fiebre de heno es un grupo de trastornos alrgicos estacionales Simplemente se tratan con medicamentos de venta libre como difenhidramina (Benadryl). Tome los medicamentos segn las indicaciones. No consuma alcohol ni conduzca mientras toma este medicamento. Consulte con el profesional que lo asiste o siga las instrucciones de uso para las dosis para nios. Si estos medicamentos no le resultan efectivos, existen muchos otros nuevos que el profesional que lo asiste puede prescribirle. Podrn utilizarse medicamentos ms fuertes tales como un spray nasal, colirios y corticoides si los primeros medicamentos que prueba no lo alivian. Si todos estos fracasan, puede utilizar otros tratamientos como la inmunoterapia o las inyecciones desensibilizantes. Haga una consulta de seguimiento con el profesional que lo asiste si los problemas continan. Estas alergias estacionales no ponen en peligro la vida. Generalmente se trata de una incomodidad que puede aliviarse con medicamentos. INSTRUCCIONES PARA EL CUIDADO DOMICILIARIO  Si no est seguro de que es lo que le   produce la reacción, lleve un registro de los alimentos que come y los síntomas que le siguen. Evite los alimentos que le producen reacciones. °· Si presenta urticaria o una erupción  cutánea: °· Tome los medicamentos como se le indicó. °· Puede utilizar un antihistamínico de venta libre (difenhidramina) para la urticaria y la picazón, según sea necesario. °· Aplíquese compresas sobre la piel o tome baños de agua fría. Evite los baños o las duchas calientes. El calor puede hacer que la urticaria y la picazón empeoren. °· Si usted es muy alérgico: °· Como consecuencia de un tratamiento para una reacción grave, puede necesitar ser hospitalizado para recibir un seguimiento intensivo. °· Utilice un brazalete o collar de alerta médico, indicando que usted es alérgico. °· Usted y su familia deben aprender a administrar adrenalina o a utilizar un kit anafiláctico. °· Si usted ya ha sufrido una reacción grave, siempre lleve el kit anafiláctico o el EpiPen® con usted. Si sufre una reacción grave, utilice esta medicación del modo en que se lo indicó el profesional que lo asiste. Una falla puede conllevar consecuencias fatales. °SOLICITE ATENCIÓN MÉDICA SI: °· Sospecha que puede sufrir una alergia a algún alimento. Los síntomas generalmente ocurren dentro de los 30 minutos posteriores a haber ingerido el alimento. °· Los síntomas persistieron durante 2 días o han empeorado. °· Desarrolla nuevos síntomas. °· Quiere volver a probar o que su hijo consuma nuevamente un alimento o bebida que usted cree que le causa una reacción alérgica. Nunca lo haga si ha sufrido una reacción anafiláctica a ese alimento o a esa bebida con anterioridad. Sólo inténtelo bajo la supervisión del médico. °SOLICITE ATENCIÓN MÉDICA DE INMEDIATO SI: °· Presenta dificultad para respirar, jadea o tiene una sensación de opresión en el pecho o en la garganta. °· Tiene la boca hinchada, o presenta urticaria, hinchazón o picazón en todo el cuerpo. °· Ha sufrido una reacción grave que ha respondido a medicamentos como el kit anafiláctico o al EpiPen®. Estas reacciones pueden volver a presentarse cuando haya terminado la medicación. Estas  reacciones deben considerarse como que ponen en peligro la vida. °ESTÉ SEGURO QUE:  °· Comprende las instrucciones para el alta médica. °· Controlará su enfermedad. °· Solicitará atención médica de inmediato según las indicaciones. °Document Released: 02/25/2005 Document Revised: 06/22/2012 °Exit[employer]® Patient Information ©2014 [employer]. ° °

## 2013-06-22 NOTE — Telephone Encounter (Signed)
Pt came in today for a visit but forgot to ask the doctor to put in a few referrals on her behalf; pt would like referrals to both opthalmology and dental; please f/u with pt about her referrals

## 2013-07-06 ENCOUNTER — Telehealth: Payer: Self-pay

## 2013-07-06 ENCOUNTER — Other Ambulatory Visit: Payer: Self-pay

## 2013-07-06 DIAGNOSIS — Z0000 Encounter for general adult medical examination without abnormal findings: Secondary | ICD-10-CM

## 2013-07-06 NOTE — Telephone Encounter (Signed)
Put referral in system for opthalmology and dentist Arna Mediciora will call patient and let them know

## 2013-07-25 ENCOUNTER — Encounter (HOSPITAL_COMMUNITY): Payer: Self-pay | Admitting: Emergency Medicine

## 2013-07-25 ENCOUNTER — Emergency Department (HOSPITAL_COMMUNITY): Payer: No Typology Code available for payment source

## 2013-07-25 ENCOUNTER — Emergency Department (HOSPITAL_COMMUNITY)
Admission: EM | Admit: 2013-07-25 | Discharge: 2013-07-25 | Disposition: A | Discharge status: Home / Self Care | Payer: No Typology Code available for payment source | Attending: Emergency Medicine | Admitting: Emergency Medicine

## 2013-07-25 DIAGNOSIS — R071 Chest pain on breathing: Secondary | ICD-10-CM | POA: Insufficient documentation

## 2013-07-25 DIAGNOSIS — R1110 Vomiting, unspecified: Secondary | ICD-10-CM | POA: Insufficient documentation

## 2013-07-25 DIAGNOSIS — K219 Gastro-esophageal reflux disease without esophagitis: Secondary | ICD-10-CM | POA: Insufficient documentation

## 2013-07-25 DIAGNOSIS — Z79899 Other long term (current) drug therapy: Secondary | ICD-10-CM | POA: Insufficient documentation

## 2013-07-25 DIAGNOSIS — R0789 Other chest pain: Secondary | ICD-10-CM

## 2013-07-25 LAB — BASIC METABOLIC PANEL
BUN: 12 mg/dL (ref 6–23)
CHLORIDE: 103 meq/L (ref 96–112)
CO2: 23 mEq/L (ref 19–32)
Calcium: 9.6 mg/dL (ref 8.4–10.5)
Creatinine, Ser: 0.71 mg/dL (ref 0.50–1.10)
GFR calc Af Amer: >90 mL/min (ref >90)
GFR calc non Af Amer: >90 mL/min (ref >90)
Glucose, Bld: 109 mg/dL — ABNORMAL HIGH (ref 70–99)
POTASSIUM: 3.5 meq/L — AB (ref 3.7–5.3)
Sodium: 142 mEq/L (ref 137–147)

## 2013-07-25 LAB — CBC
HEMATOCRIT: 40.3 % (ref 36.0–46.0)
Hemoglobin: 14 g/dL (ref 12.0–15.0)
MCH: 30.1 pg (ref 26.0–34.0)
MCHC: 34.7 g/dL (ref 30.0–36.0)
MCV: 86.7 fL (ref 78.0–100.0)
Platelets: 218 10*3/uL (ref 150–400)
RBC: 4.65 MIL/uL (ref 3.87–5.11)
RDW: 12.4 % (ref 11.5–15.5)
WBC: 7.7 10*3/uL (ref 4.0–10.5)

## 2013-07-25 LAB — I-STAT TROPONIN, ED
Troponin i, poc: 0.01 ng/mL (ref 0.00–0.08)
Troponin i, poc: 0 ng/mL (ref 0.00–0.08)

## 2013-07-25 MED ORDER — GI COCKTAIL ~~LOC~~
30.0000 mL | Freq: Once | ORAL | Status: AC
  Administered 2013-07-25: 30 mL via ORAL
  Filled 2013-07-25: qty 30

## 2013-07-25 MED ORDER — IBUPROFEN 800 MG PO TABS: 800.0000 mg | ORAL_TABLET | Freq: Three times a day (TID) | ORAL | Status: DC | PRN

## 2013-07-25 MED ORDER — HYDROCODONE-ACETAMINOPHEN 5-325 MG PO TABS: 1.0000 | ORAL_TABLET | Freq: Four times a day (QID) | ORAL | Status: DC | PRN

## 2013-07-25 MED ORDER — ONDANSETRON HCL 4 MG/2ML IJ SOLN
4.0000 mg | Freq: Once | INTRAMUSCULAR | Status: AC
  Administered 2013-07-25: 4 mg via INTRAVENOUS
  Filled 2013-07-25: qty 2

## 2013-07-25 MED ORDER — SODIUM CHLORIDE 0.9 % IV BOLUS (SEPSIS)
1000.0000 mL | Freq: Once | INTRAVENOUS | Status: AC
  Administered 2013-07-25: 1000 mL via INTRAVENOUS

## 2013-07-25 MED ORDER — MORPHINE SULFATE 4 MG/ML IJ SOLN
4.0000 mg | Freq: Once | INTRAMUSCULAR | Status: AC
  Administered 2013-07-25: 4 mg via INTRAVENOUS
  Filled 2013-07-25: qty 1

## 2013-07-25 NOTE — ED Provider Notes (Signed)
Medical screening examination/treatment/procedure(s) were performed by non-physician practitioner and as supervising physician I was immediately available for consultation/collaboration.   EKG Interpretation   Date/Time:  Sunday Jul 25 2013 08:29:18 EDT Ventricular Rate:  78 PR Interval:  148 QRS Duration: 76 QT Interval:  370 QTC Calculation: 421 R Axis:   62 Text Interpretation:  Normal sinus rhythm T wave abnormality, consider  inferior ischemia T wave abnormality, consider anterior ischemia Abnormal  ECG No significant change since last tracing Confirmed by Riannon Mukherjee  MD-J, Keron Neenan  ([id number]) on 07/25/2013 8:36:31 AM      Celene KrasJon R Taiya Nutting, MD 07/25/13 1601

## 2013-07-25 NOTE — ED Notes (Signed)
Pt c/o center chest squeezing sensation onset 2 days ago with N/V, shortness of breath and diaphoresis.

## 2013-07-25 NOTE — ED Provider Notes (Signed)
CSN: [id number]     Arrival date & time 07/25/13  0825 History   First MD Initiated Contact with Patient 07/25/13 0845     Chief Complaint  Patient presents with  . Chest Pain  . Emesis     (Consider location/radiation/quality/duration/timing/severity/associated sxs/prior Treatment) HPI Patient presents to the emergency department with a two-day history of constant, chest pain.  Patient, states it feels, on the side of her chest are squeezing.  Patient, states, that that she has a history of acid reflux and does feel, like she's having similar type pain.  To that.  The patient, states, that she does not have any abdominal pain diarrhea, headache, blurred vision, weakness, numbness, dizziness, rash, cough, runny nose, sore throat, back pain, dysuria, vaginal bleeding, vaginal discharge, or syncope.  He should states, that she has had some neck discomfort in the back of her neck bilaterally.  Patient, states, that seems to make her condition, better or worse.  The patient, states, that she's had some nausea, but only one episode of vomiting.  Patient, states, that she has not been seen by her doctor any other facility for this discomfort History reviewed. No pertinent past medical history. Past Surgical History  Procedure Laterality Date  . Cholecystectomy N/A 10/23/2012    Procedure: LAPAROSCOPIC CHOLECYSTECTOMY WITH INTRAOPERATIVE CHOLANGIOGRAM;  Surgeon: Currie Parishristian J Streck, MD;  Location: [facility] OR;  Service: General;  Laterality: N/A;   No family history on file. History  Substance Use Topics  . Smoking status: Never Smoker   . Smokeless tobacco: Never Used  . Alcohol Use: No   OB History   Grav Para Term Preterm Abortions TAB SAB Ect Mult Living                 Review of Systems  All other systems negative except as documented in the HPI. All pertinent positives and negatives as reviewed in the HPI.  Allergies  Shrimp  Home Medications   Prior to Admission medications    Medication Sig Start Date End Date Taking? Authorizing Provider  famotidine (PEPCID) 20 MG tablet Take 1 tablet (20 mg total) by mouth 2 (two) times daily. 05/19/13  Yes Peter S Dammen, PA-C  ibuprofen (ADVIL,MOTRIN) 200 MG tablet Take 400-600 mg by mouth daily as needed for pain. Usually takes 2 tablets (400 mg), 3 tablets (600 mg) for severe pain   Yes Historical Provider, MD  omeprazole (PRILOSEC) 20 MG capsule Take 1 capsule (20 mg total) by mouth daily. 05/19/13  Yes Peter S Dammen, PA-C   BP 114/67  Pulse 61  Temp(Src) 98.2 F (36.8 C) (Oral)  Resp 14  Ht 5\' 3"  (1.6 m)  SpO2 100% Physical Exam  Nursing note and vitals reviewed. Constitutional: She is oriented to person, place, and time. She appears well-developed and well-nourished. No distress.  HENT:  Head: Normocephalic and atraumatic.  Mouth/Throat: Oropharynx is clear and moist.  Eyes: Pupils are equal, round, and reactive to light.  Neck: Normal range of motion. Neck supple.  Cardiovascular: Normal rate, regular rhythm and normal heart sounds.  Exam reveals no gallop and no friction rub.   No murmur heard. Pulmonary/Chest: Effort normal and breath sounds normal. No respiratory distress. She exhibits tenderness.  Neurological: She is alert and oriented to person, place, and time. She exhibits normal muscle tone. Coordination normal.  Skin: Skin is warm and dry. No erythema.    ED Course  Procedures (including critical care time) Labs Review Labs Reviewed  BASIC METABOLIC  PANEL - Abnormal; Notable for the following:    Potassium 3.5 (*)    Glucose, Bld 109 (*)    All other components within normal limits  CBC  I-STAT TROPOININ, ED  I-STAT TROPOININ, ED    Imaging Review Dg Chest 2 View (if Patient Has Fever And/or Copd)  07/25/2013   CLINICAL DATA:  Shortness of breath and chest pain  EXAM: CHEST  2 VIEW  COMPARISON:  October 23, 2012  FINDINGS: Lungs are clear. Heart size and pulmonary vascularity are normal. No  pneumothorax. No adenopathy. No bone lesions.  IMPRESSION: No abnormality noted.   Electronically Signed   By: Bretta BangWilliam  Woodruff M.D.   On: 07/25/2013 09:31     EKG Interpretation   Date/Time:  Sunday Jul 25 2013 08:29:18 EDT Ventricular Rate:  78 PR Interval:  148 QRS Duration: 76 QT Interval:  370 QTC Calculation: 421 R Axis:   62 Text Interpretation:  Normal sinus rhythm T wave abnormality, consider  inferior ischemia T wave abnormality, consider anterior ischemia Abnormal  ECG No significant change since last tracing Confirmed by KNAPP  MD-J, JON  ([id number]) on 07/25/2013 8:36:31 AM       Patient had an atypical presentation cardiac history initiated 2 days with a constant bilateral chest pressure and the patient had increased pain with palpation of the right side of her chest, especially.  Patient is an obvious signs of distress and is showing no signs of respiratory distress here in the emergency department.  I advised the patient to followup with her primary care Dr. told to return here as needed.  The patient is PERC negative  Carlyle DollyChristopher W Diamante Rubin, PA-C 07/25/13 1455

## 2013-07-25 NOTE — Discharge Instructions (Signed)
Followup with your primary care doctor.  Return here as needed.  Her testing here today did not show any abnormality

## 2013-07-26 ENCOUNTER — Encounter: Payer: Self-pay | Admitting: Internal Medicine

## 2013-07-26 ENCOUNTER — Ambulatory Visit: Payer: No Typology Code available for payment source | Attending: Internal Medicine | Admitting: Internal Medicine

## 2013-07-26 VITALS — BP 135/92 | HR 91 | Temp 98.6°F | Resp 16 | Wt 147.8 lb

## 2013-07-26 DIAGNOSIS — Z79899 Other long term (current) drug therapy: Secondary | ICD-10-CM | POA: Insufficient documentation

## 2013-07-26 DIAGNOSIS — F329 Major depressive disorder, single episode, unspecified: Secondary | ICD-10-CM | POA: Insufficient documentation

## 2013-07-26 DIAGNOSIS — R5381 Other malaise: Secondary | ICD-10-CM | POA: Insufficient documentation

## 2013-07-26 DIAGNOSIS — Z91013 Allergy to seafood: Secondary | ICD-10-CM | POA: Insufficient documentation

## 2013-07-26 DIAGNOSIS — K219 Gastro-esophageal reflux disease without esophagitis: Secondary | ICD-10-CM | POA: Insufficient documentation

## 2013-07-26 DIAGNOSIS — R5383 Other fatigue: Principal | ICD-10-CM

## 2013-07-26 MED ORDER — ONDANSETRON HCL 4 MG PO TABS: 4.0000 mg | ORAL_TABLET | Freq: Three times a day (TID) | ORAL | Status: DC | PRN

## 2013-07-26 MED ORDER — PAROXETINE HCL 10 MG PO TABS: 10.0000 mg | ORAL_TABLET | Freq: Every day | ORAL | Status: DC

## 2013-07-26 MED ORDER — PANTOPRAZOLE SODIUM 40 MG PO TBEC: 40.0000 mg | DELAYED_RELEASE_TABLET | Freq: Every day | ORAL | Status: DC

## 2013-07-26 NOTE — Progress Notes (Signed)
Patient ID: Shannon Eaton, female   DOB: 1960-11-18, 53 y.o.   MRN: [id number]   Shannon Eaton, is a 53 y.o. female  [id number]  [id number]  DOB - 1960-11-18  Chief Complaint  Patient presents with  . Headches        Subjective:   Shannon Eaton is a 53 y.o. female here today for a follow up visit. Patient was seen in the ER yesterday for chest pain and MI was ruled out, she also complained of headache for which she was prescribed Vicodin. She came in today as a walk-in complaining of the same symptoms in addition to back ache, headache mostly at the occipital region, neck pain, generalized body weakness, tiredness, insomnia, decreased appetite, and anhedonia. Patient said yes when asked if she is depressed, she agrees to being constantly worried about everything and anything, she says she is constantly thinking about what could go wrong including financial constraints and the kids. She lives with her husband and 3 children, teenagers. She was tearful during this encounter. She does not smoke cigarette she does not drink alcohol. She currently denies chest pain or shortness of breath, no abdominal or urinary concerns, no specific focal neurological symptoms. She denied any suicidal ideation or thoughts. Patient has No headache, No chest pain, No abdominal pain - No Nausea, No new weakness tingling or numbness, No Cough - SOB.  Problem  Other Malaise and Fatigue  Gerd (Gastroesophageal Reflux Disease)  Major Depression    ALLERGIES: Allergies  Allergen Reactions  . Shrimp [Shellfish Allergy] Other (See Comments)    Shrimp ceviche causes tongues and gums to swell    PAST MEDICAL HISTORY: No past medical history on file.  MEDICATIONS AT HOME: Prior to Admission medications   Medication Sig Start Date End Date Taking? Authorizing Provider  HYDROcodone-acetaminophen (NORCO/VICODIN) 5-325 MG per tablet Take 1 tablet by mouth every 6 (six) hours as needed for  moderate pain. 07/25/13  Yes Jamesetta Orleanshristopher W Lawyer, PA-C  omeprazole (PRILOSEC) 20 MG capsule Take 1 capsule (20 mg total) by mouth daily. 05/19/13  Yes Peter S Dammen, PA-C  famotidine (PEPCID) 20 MG tablet Take 1 tablet (20 mg total) by mouth 2 (two) times daily. 05/19/13   Phill MutterPeter S Dammen, PA-C  ibuprofen (ADVIL,MOTRIN) 800 MG tablet Take 1 tablet (800 mg total) by mouth every 8 (eight) hours as needed. 07/25/13   Jamesetta Orleanshristopher W Lawyer, PA-C  ondansetron (ZOFRAN) 4 MG tablet Take 1 tablet (4 mg total) by mouth every 8 (eight) hours as needed for nausea or vomiting. 07/26/13   Jeanann Lewandowskylugbemiga Marqueze Ramcharan, MD  pantoprazole (PROTONIX) 40 MG tablet Take 1 tablet (40 mg total) by mouth daily. 07/26/13   Jeanann Lewandowskylugbemiga Arista Kettlewell, MD  PARoxetine (PAXIL) 10 MG tablet Take 1 tablet (10 mg total) by mouth daily. 07/26/13   Jeanann Lewandowskylugbemiga Briele Lagasse, MD     Objective:   Filed Vitals:   07/26/13 1603 07/26/13 1604  BP: 131/82 135/92  Pulse: 82 91  Temp: 98.6 F (37 C)   TempSrc: Oral   Resp: 16   Weight: 147 lb 12.8 oz (67.042 kg)   SpO2: 96%     Exam General appearance : Awake, alert, not in any distress. Speech Clear. Not toxic looking HEENT: Atraumatic and Normocephalic, pupils equally reactive to light and accomodation Neck: supple, no JVD. No cervical lymphadenopathy.  Chest:Good air entry bilaterally, no added sounds  CVS: S1 S2 regular, no murmurs.  Abdomen: Bowel sounds present, Non tender and not distended with no gaurding, rigidity  or rebound. Extremities: B/L Lower Ext shows no edema, both legs are warm to touch Neurology: Awake alert, and oriented X 3, CN II-XII intact, Non focal Skin:No Rash Wounds:N/A  Data Review Lab Results  Component Value Date   HGBA1C 5.6 06/22/2013     Assessment & Plan   1. Other malaise and fatigue These is possibly as a result of major depression. Patient has many bodily symptoms including headache, fatigue, abdominal pain, chest pain, leg pain, nausea, vomiting, generalized  body weakness, and decreased appetite as well as insomnia. She has been worked up extensively, cardiac etiology ruled out, no significant findings in the abdomen. I think these are all related to major depression.  2. GERD (gastroesophageal reflux disease)  - PARoxetine (PAXIL) 10 MG tablet; Take 1 tablet (10 mg total) by mouth daily.  Dispense: 30 tablet; Refill: 3 - ondansetron (ZOFRAN) 4 MG tablet; Take 1 tablet (4 mg total) by mouth every 8 (eight) hours as needed for nausea or vomiting.  Dispense: 20 tablet; Refill: 0  3. Major depression  - pantoprazole (PROTONIX) 40 MG tablet; Take 1 tablet (40 mg total) by mouth daily.  Dispense: 30 tablet; Refill: 3  Patient was counseled extensively about nutrition and exercise Patient was given an appointment to see our behavioral therapist in the clinic today for counseling Interpreter was used to communicate directly with patient for the entire encounter including providing detailed patient instructions.    Return in about 3 months (around 10/26/2013), or if symptoms worsen or fail to improve, for Follow up Pain and comorbidities, Abdominal Pain.  The patient was given clear instructions to go to ER or return to medical center if symptoms don't improve, worsen or new problems develop. The patient verbalized understanding. The patient was told to call to get lab results if they haven't heard anything in the next week.   This note has been created with Education officer, environmentalDragon speech recognition software and smart phrase technology. Any transcriptional errors are unintentional.    Jeanann Lewandowskylugbemiga Nadir Vasques, MD, MHA, FACP, [facility] and [facility] [place], [place] [phone]   07/26/2013, 4:44 PM

## 2013-07-26 NOTE — Progress Notes (Signed)
Patient here today complaining of a headache since Friday that causes the back of the neck to hurt. Patient reports she is dizzy and nauseated. Patient states when she is sitting still and looks to the side she sees spots.

## 2013-07-26 NOTE — Patient Instructions (Signed)
Trastorno Location managerdepresivo mayor  (Major Depressive Disorder) El trastorno depresivo mayor es una enfermedad mental. Tambin se llamado depresin clnica o depresin unipolar. Produce sentimientos de tristeza, desesperanza o desamparo. Algunas personas con trastorno depresivo mayor no se sienten particularmente tristes, pero pierden Press photographerel inters en hacer las cosas que solan disfrutar (anhedonia). Tambin puede causar sntomas fsicos. Interfiere en el trabajo, la escuela, las relaciones y otras actividades diarias normales. Puede variar en gravedad, pero es ms duradera y ms grave que la tristeza que todos sentimos de vez en cuando en nuestras vidas.  Muchas veces es desencadenada por sucesos estresantes o cambios importantes en la vida. Algunos ejemplos de estos factores desencadenantes son el divorcio, la prdida del trabajo o el hogar, [place] mudanza, y la muerte de un familiar o amigo cercano. A veces aparece sin ninguna razn evidente. Las personas que tienen familiares con depresin mayor o con trastorno bipolar tienen ms riesgo de desarrollar depresin mayor con o sin factores de Librarian, academicestrs. Puede ocurrir a Actuarycualquier edad. Puede ocurrir slo una vez en su vida(episodio nico de trastorno depresivo mayor). Puede ocurrir varias veces (trastorno depresivo mayor recurrente).  SNTOMAS  Las personas con trastorno depresivo mayor presentan anhedonia o estado de nimo deprimido casi CarMaxtodos los das durante al menos 2 semanas o ms. Los sntomas son:   Sensacin de tristeza Designer, jewellery(melancola) o vaco.  Sentimientos de desesperanza o desamparo.  Lagrimeo o episodios de llanto ( es observado por los dems).  Irritabilidad (en nios y adolescentes). Adems del estado de nimo deprimido o la anhedonia o ambos, estos enfermos tienen al menos cuatro de los siguientes sntomas :   Dificultad para dormir o Engineer, sitedormir demasiado.   Cambio significativo (aumento o disminucin) en el apetito o Education officer, communityel peso.   Falta de energa o  motivacin.  Sentimientos de culpa o desvalorizacin.   Dificultad para concentrarse, recordar o tomar decisiones.  Movimientos inusualmente lentos (retardo psicomotor retardation) o inquietud (segn lo observado por los dems).   Deseos recurrentes de muerte, pensamientos recurrentes de autoagresin (suicidio) o intento de suicidio. Las personas con trastorno depresivo mayor suelen tener pensamientos persistentes negativos acerca de s mismos, de otras personas y del mundo. Las personas con trastorno depresivo mayor grave pueden experimentar creencias o percepciones distorsionadas sobre el mundo (delirios psicticos). Tambin pueden ver u or cosas que no son reales(alucinaciones psicticas).  DIAGNSTICO  El diagnstico se realiza mediante una evaluacin hecha por el mdico. El mdico le preguntar acerca de los aspectos de su vida cotidiana, como el [place]estado de nimo, el sueo y el apetito, para ver si usted tiene los sntomas de Animatordepresin mayor. Le har preguntas sobre su historial mdico y el consumo de alcohol o drogas, incluyendo medicamentos recetados. Nucor Corporationambin le har un examen fsico y Education administratorle indicar anlisis de Freelandsangre. Esto se debe a que ciertas enfermedades y el uso de determinadas sustancias pueden causar sntomas similares a la depresin (depresin secundaria). Su mdico tambin podra derivarlo a Proofreaderun especialista en salud mental para Neomia Dearuna evaluacin y Bryanttratamiento.  TRATAMIENTO  Es Public librarianimportante reconocer los sntomas y Forensic scientistbuscar tratamiento. Los siguientes tratamientos pueden indicarse:    Medicamentos - Generalmente se recetan antidepresivos. Los antidepresivos se piensa que corrigen los desequilibrios qumicos en el cerebro que se asocian comnmente a la depresin Forensic scientistmayor. Se pueden agregar otros tipos de medicamentos si los sntomas no responden a los antidepresivos solos o si hay ideas delirantes o alucinaciones psicticas.  Psicoterapia - ciertos tipos de psicoterapia pueden ser tiles en el  tratamiento del trastorno  de la depresin mayor, proporcionando apoyo, educacin y orientacin. Ciertos tipos de psicoterapia tambin pueden ayudar a superar los pensamientos negativos (terapia cognitivo conductual) y a problemas de relacin que desencadena la depresin mayor (terapia interpersonal). Un especialista en salud mental puede ayudarlo a determinar qu tratamiento es el mejor para usted. La Harley-Davidsonmayora de los pacientes mejoran con una combinacin de medicacin y psicoterapia. Los tratamientos que implican la estimulacin elctrica del cerebro pueden ser utilizados en situaciones con sntomas muy graves o cuando los medicamentos y la psicoterapia no funcionan despus de un Forest Ranchtiempo. Estos tratamientos incluyen terapia electroconvulsiva, estimulacin magntica transcraneal y estimulacin del nervio vago.  Document Released: 06/22/2012 [facility] Patient Information 2014 [place], [place].

## 2013-07-28 ENCOUNTER — Ambulatory Visit: Payer: No Typology Code available for payment source

## 2013-07-28 ENCOUNTER — Telehealth: Payer: Self-pay | Admitting: *Deleted

## 2013-07-28 ENCOUNTER — Telehealth: Payer: Self-pay | Admitting: Internal Medicine

## 2013-07-28 DIAGNOSIS — K929 Disease of digestive system, unspecified: Secondary | ICD-10-CM

## 2013-07-28 MED ORDER — DICYCLOMINE HCL 20 MG PO TABS
20.0000 mg | ORAL_TABLET | Freq: Four times a day (QID) | ORAL | Status: DC
Start: 2013-07-28 — End: 2013-12-13

## 2013-07-28 NOTE — Telephone Encounter (Signed)
Call placed to patient using interpreter services. Patient states she is still nauseas and weak from not eating. Patient states the Zofran is not working. Patient given a nurse visit appointment for today at 4:15 PM. Reather LaurenceJamie R Pearl Bents, RN

## 2013-07-28 NOTE — Telephone Encounter (Signed)
Pt was seen on 07/27/13 and has been taking medication that was prescribed but says that is still experiencing symptoms. She feels dizzy, her stomach hurts and can't eat or sleep. [facility] but is not sure why she was referred there because she only has mild depression. Pt would like a referral to a specialist for stomach issues and evaluation of medications because not feeling better. Please f/u with pt, pt requires spanish interpreter.

## 2013-07-28 NOTE — Telephone Encounter (Signed)
Called to let patient (via interpreter services) know I spoke with Dr. Hyman HopesJegede regarding her symptoms. Informed patient per Dr. Hyman HopesJegede she has not given the medications time to work and will be prescribed Bentyl. Patient instructed to give this medication regimen one week to work if no improvement return to the clinic. Patient verbalized understanding. Reather LaurenceJamie R Suzane Vanderweide, RN

## 2013-07-28 NOTE — Telephone Encounter (Signed)
Pt was seen on 07/27/13 and has been taking medication that was prescribed but says that is still experiencing symptoms. She feels dizzy, her stomach hurts and can't eat or sleep. Also [facility] but is not sure why she was referred there because she only has mild depression. Pt would like a referral to a specialist for stomach issues and evaluation of medications because not feeling better. Please f/u with pt, pt requires spanish interpreter.  ° °

## 2013-07-31 ENCOUNTER — Emergency Department (HOSPITAL_COMMUNITY)
Admission: EM | Admit: 2013-07-31 | Discharge: 2013-07-31 | Disposition: A | Discharge status: Home / Self Care | Payer: Self-pay | Source: Home / Self Care | Attending: Family Medicine | Admitting: Family Medicine

## 2013-07-31 ENCOUNTER — Encounter (HOSPITAL_COMMUNITY): Payer: Self-pay | Admitting: Emergency Medicine

## 2013-07-31 DIAGNOSIS — N951 Menopausal and female climacteric states: Secondary | ICD-10-CM

## 2013-07-31 MED ORDER — GI COCKTAIL ~~LOC~~
30.0000 mL | Freq: Once | ORAL | Status: AC
Start: 2013-07-31 — End: 2013-07-31
  Administered 2013-07-31: 30 mL via ORAL

## 2013-07-31 MED ORDER — ESTROVEN EXTRA STRENGTH PO TABS: 1.0000 | ORAL_TABLET | Freq: Every morning | ORAL | Status: DC

## 2013-07-31 MED ORDER — RANITIDINE HCL 150 MG PO TABS: 150.0000 mg | ORAL_TABLET | Freq: Two times a day (BID) | ORAL | Status: DC

## 2013-07-31 MED ORDER — GI COCKTAIL ~~LOC~~
ORAL | Status: AC
  Filled 2013-07-31: qty 30

## 2013-07-31 NOTE — ED Provider Notes (Signed)
CSN: [id number]     Arrival date & time 07/31/13  0903 History   First MD Initiated Contact with Patient 07/31/13 [id number]     Chief Complaint  Patient presents with  . Abdominal Pain   (Consider location/radiation/quality/duration/timing/severity/associated sxs/prior Treatment) Patient is a 53 y.o. female presenting with abdominal pain. The history is provided by the patient, a relative and the spouse.  Abdominal Pain Pain location:  Epigastric Pain quality: burning   Pain severity:  Mild Onset quality:  Gradual Duration:  1 week Timing:  Intermittent Progression:  Waxing and waning Chronicity:  New Context: previous surgery   Context: not sick contacts   Context comment:  Assoc hot flashes over entire body, 6772yrs into menopause., s/p chole , 2 c sections. Associated symptoms: chills and nausea   Associated symptoms: no chest pain, no constipation, no cough, no diarrhea, no dysuria, no fever and no vomiting   Risk factors comment:  [facility].   History reviewed. No pertinent past medical history. Past Surgical History  Procedure Laterality Date  . Cholecystectomy N/A 10/23/2012    Procedure: LAPAROSCOPIC CHOLECYSTECTOMY WITH INTRAOPERATIVE CHOLANGIOGRAM;  Surgeon: Currie Parishristian J Streck, MD;  Location: [facility] OR;  Service: General;  Laterality: N/A;   History reviewed. No pertinent family history. History  Substance Use Topics  . Smoking status: Never Smoker   . Smokeless tobacco: Never Used  . Alcohol Use: No   OB History   Grav Para Term Preterm Abortions TAB SAB Ect Mult Living                 Review of Systems  Constitutional: Positive for chills. Negative for fever.  Respiratory: Negative for cough.   Cardiovascular: Negative for chest pain.  Gastrointestinal: Positive for nausea and abdominal pain. Negative for vomiting, diarrhea and constipation.  Genitourinary: Negative for dysuria.    Allergies  Shrimp  Home Medications   Prior  to Admission medications   Medication Sig Start Date End Date Taking? Authorizing Provider  dicyclomine (BENTYL) 20 MG tablet Take 1 tablet (20 mg total) by mouth every 6 (six) hours. 07/28/13   Jeanann Lewandowskylugbemiga Jegede, MD  famotidine (PEPCID) 20 MG tablet Take 1 tablet (20 mg total) by mouth 2 (two) times daily. 05/19/13   Angus SellerPeter S Dammen, PA-C  HYDROcodone-acetaminophen (NORCO/VICODIN) 5-325 MG per tablet Take 1 tablet by mouth every 6 (six) hours as needed for moderate pain. 07/25/13   Jamesetta Orleanshristopher W Lawyer, PA-C  ibuprofen (ADVIL,MOTRIN) 800 MG tablet Take 1 tablet (800 mg total) by mouth every 8 (eight) hours as needed. 07/25/13   Jamesetta Orleanshristopher W Lawyer, PA-C  omeprazole (PRILOSEC) 20 MG capsule Take 1 capsule (20 mg total) by mouth daily. 05/19/13   Phill MutterPeter S Dammen, PA-C  ondansetron (ZOFRAN) 4 MG tablet Take 1 tablet (4 mg total) by mouth every 8 (eight) hours as needed for nausea or vomiting. 07/26/13   Jeanann Lewandowskylugbemiga Jegede, MD  pantoprazole (PROTONIX) 40 MG tablet Take 1 tablet (40 mg total) by mouth daily. 07/26/13   Jeanann Lewandowskylugbemiga Jegede, MD  PARoxetine (PAXIL) 10 MG tablet Take 1 tablet (10 mg total) by mouth daily. 07/26/13   Jeanann Lewandowskylugbemiga Jegede, MD   BP 148/95  Pulse 75  Temp(Src) 98.9 F (37.2 C) (Oral)  Resp 16  SpO2 96% Physical Exam  Nursing note and vitals reviewed. Constitutional: She is oriented to person, place, and time. She appears well-developed and well-nourished.  Neck: Normal range of motion. Neck supple.  Cardiovascular: Normal heart  sounds.   Pulmonary/Chest: Effort normal and breath sounds normal.  Abdominal: Soft. Bowel sounds are normal. She exhibits no distension and no mass. There is no tenderness. There is no rebound and no guarding.  Neurological: She is alert and oriented to person, place, and time.  Skin: Skin is warm and dry.    ED Course  Procedures (including critical care time) Labs Review Labs Reviewed - No data to display  Imaging Review No results found.   MDM    1. Menopausal syndrome (hot flashes)       Linna Hoff, MD 07/31/13 [phone]

## 2013-07-31 NOTE — ED Notes (Signed)
Pt  Reports  Symptoms  Of    Epigastric  Pain  With  Nausea      X  1   Week         Also  Reports    Feels  Anxious  As  Well    Pt  Seen  Er    1  Week   Ago  Then   At  [facility]      6  Days  Ago

## 2013-07-31 NOTE — Discharge Instructions (Signed)
Take medicine daily and see specialist for further care.

## 2013-08-05 ENCOUNTER — Encounter: Payer: Self-pay | Admitting: Gastroenterology

## 2013-08-05 ENCOUNTER — Telehealth: Payer: Self-pay | Admitting: Internal Medicine

## 2013-08-05 DIAGNOSIS — N959 Unspecified menopausal and perimenopausal disorder: Secondary | ICD-10-CM

## 2013-08-05 NOTE — Telephone Encounter (Signed)
Patient states she is depressed, can't sleep, can't eat, having hot flashes, when having intercourse has a lot of vaginal. Patient states she is going through menopause. Informed patient she will need to be referred to an OB/GYN. Reather Laurence, RN

## 2013-08-05 NOTE — Telephone Encounter (Signed)
Pt has come in today to speak with nurse about some ongoing symptoms she has been having; please contact pt about coming in for a visit to be triaged; pt states that her hands are shaking, headaches and trouble sleeping at night; please f/u with pt at [phone]; pt is open to coming back to the office this afternoon

## 2013-08-12 ENCOUNTER — Encounter: Payer: Self-pay | Admitting: Obstetrics & Gynecology

## 2013-08-17 ENCOUNTER — Ambulatory Visit: Payer: Self-pay | Admitting: Gastroenterology

## 2013-08-24 ENCOUNTER — Ambulatory Visit: Payer: Self-pay | Admitting: Gynecology

## 2013-09-22 ENCOUNTER — Encounter: Payer: Self-pay | Admitting: Obstetrics & Gynecology

## 2013-10-26 ENCOUNTER — Ambulatory Visit: Payer: No Typology Code available for payment source | Admitting: Internal Medicine

## 2013-12-13 ENCOUNTER — Emergency Department (HOSPITAL_COMMUNITY)
Admission: EM | Admit: 2013-12-13 | Discharge: 2013-12-13 | Disposition: A | Discharge status: Home / Self Care | Payer: Self-pay | Attending: Emergency Medicine | Admitting: Emergency Medicine

## 2013-12-13 ENCOUNTER — Emergency Department (HOSPITAL_COMMUNITY): Payer: Self-pay

## 2013-12-13 ENCOUNTER — Encounter (HOSPITAL_COMMUNITY): Payer: Self-pay | Admitting: Emergency Medicine

## 2013-12-13 DIAGNOSIS — R112 Nausea with vomiting, unspecified: Secondary | ICD-10-CM | POA: Insufficient documentation

## 2013-12-13 DIAGNOSIS — M5030 Other cervical disc degeneration, unspecified cervical region: Secondary | ICD-10-CM

## 2013-12-13 DIAGNOSIS — G44219 Episodic tension-type headache, not intractable: Secondary | ICD-10-CM | POA: Insufficient documentation

## 2013-12-13 DIAGNOSIS — M5090 Cervical disc disorder, unspecified, unspecified cervical region: Secondary | ICD-10-CM | POA: Insufficient documentation

## 2013-12-13 DIAGNOSIS — Z79899 Other long term (current) drug therapy: Secondary | ICD-10-CM | POA: Insufficient documentation

## 2013-12-13 DIAGNOSIS — R42 Dizziness and giddiness: Secondary | ICD-10-CM | POA: Insufficient documentation

## 2013-12-13 LAB — URINALYSIS, ROUTINE W REFLEX MICROSCOPIC
Bilirubin Urine: NEGATIVE
Glucose, UA: NEGATIVE mg/dL
Ketones, ur: NEGATIVE mg/dL
Leukocytes, UA: NEGATIVE
Nitrite: NEGATIVE
Protein, ur: NEGATIVE mg/dL
Specific Gravity, Urine: 1.004 — ABNORMAL LOW (ref 1.005–1.030)
Urobilinogen, UA: 0.2 mg/dL (ref 0.0–1.0)
pH: 7 (ref 5.0–8.0)

## 2013-12-13 LAB — URINE MICROSCOPIC-ADD ON

## 2013-12-13 LAB — CBC
HCT: 41.5 % (ref 36.0–46.0)
Hemoglobin: 14 g/dL (ref 12.0–15.0)
MCH: 29.1 pg (ref 26.0–34.0)
MCHC: 33.7 g/dL (ref 30.0–36.0)
MCV: 86.3 fL (ref 78.0–100.0)
Platelets: 226 10*3/uL (ref 150–400)
RBC: 4.81 MIL/uL (ref 3.87–5.11)
RDW: 12.4 % (ref 11.5–15.5)
WBC: 8.6 10*3/uL (ref 4.0–10.5)

## 2013-12-13 LAB — BASIC METABOLIC PANEL
Anion gap: 11 (ref 5–15)
BUN: 14 mg/dL (ref 6–23)
CO2: 27 mEq/L (ref 19–32)
Calcium: 9.4 mg/dL (ref 8.4–10.5)
Chloride: 102 mEq/L (ref 96–112)
Creatinine, Ser: 0.75 mg/dL (ref 0.50–1.10)
GFR calc Af Amer: >90 mL/min (ref >90)
GFR calc non Af Amer: >90 mL/min (ref >90)
Glucose, Bld: 106 mg/dL — ABNORMAL HIGH (ref 70–99)
Potassium: 3.9 mEq/L (ref 3.7–5.3)
Sodium: 140 mEq/L (ref 137–147)

## 2013-12-13 MED ORDER — MELOXICAM 7.5 MG PO TABS: 7.5000 mg | ORAL_TABLET | Freq: Every day | ORAL | Status: DC

## 2013-12-13 MED ORDER — IBUPROFEN 200 MG PO TABS
600.0000 mg | ORAL_TABLET | Freq: Once | ORAL | Status: AC
  Administered 2013-12-13: 600 mg via ORAL
  Filled 2013-12-13: qty 3

## 2013-12-13 MED ORDER — OXYCODONE-ACETAMINOPHEN 5-325 MG PO TABS
2.0000 | ORAL_TABLET | Freq: Once | ORAL | Status: AC
  Administered 2013-12-13: 2 via ORAL
  Filled 2013-12-13: qty 2

## 2013-12-13 MED ORDER — IOHEXOL 350 MG/ML SOLN
80.0000 mL | Freq: Once | INTRAVENOUS | Status: AC | PRN
  Administered 2013-12-13: 80 mL via INTRAVENOUS

## 2013-12-13 MED ORDER — MECLIZINE HCL 25 MG PO TABS
25.0000 mg | ORAL_TABLET | Freq: Once | ORAL | Status: AC
  Administered 2013-12-13: 25 mg via ORAL
  Filled 2013-12-13: qty 1

## 2013-12-13 NOTE — ED Provider Notes (Signed)
CSN: [id number]     Arrival date & time 12/13/13  0807 History   First MD Initiated Contact with Patient 12/13/13 [phone]     Chief Complaint  Patient presents with  . Nausea  . Dizziness  . Headache     (Consider location/radiation/quality/duration/timing/severity/associated sxs/prior Treatment) Patient is a 53 y.o. female presenting with dizziness and headaches. The history is provided by the patient and a relative. The history is limited by a language barrier. A language interpreter was used (daughter).  Dizziness Associated symptoms: headaches, nausea and vomiting   Associated symptoms: no chest pain, no diarrhea, no palpitations and no shortness of breath   Headache Associated symptoms: dizziness, myalgias, nausea, neck pain and vomiting   Associated symptoms: no abdominal pain, no back pain, no cough, no diarrhea, no fever, no neck stiffness and no numbness   Pt is a 53yo female presenting to ED with c/o neck pain and headache, associated with dizziness described as room spining, associated with nausea and vomiting since Friday, 10/2.  Pt was evaluated by her PCP at that time and prescribed fluoxetine, cyclobenzaprine, and butalbital/acetaminophen/caffeine w/o relief in symptoms.  Pain in neck is aching, 8/10 at worst.  Reports 2 episodes of vomiting today. Denies change in balance but does states she feels more dizzy when she stands or moves her head certain directions.  Denies fever or chills. Does report hx of similar symptoms previously that resolved on their own. Denies chest pain, SOB, numbness or weakness.    History reviewed. No pertinent past medical history. Past Surgical History  Procedure Laterality Date  . Cholecystectomy N/A 10/23/2012    Procedure: LAPAROSCOPIC CHOLECYSTECTOMY WITH INTRAOPERATIVE CHOLANGIOGRAM;  Surgeon: Currie Paris, MD;  Location: [facility] OR;  Service: General;  Laterality: N/A;   History reviewed. No pertinent family history. History  Substance Use  Topics  . Smoking status: Never Smoker   . Smokeless tobacco: Never Used  . Alcohol Use: No   OB History   Grav Para Term Preterm Abortions TAB SAB Ect Mult Living                 Review of Systems  Constitutional: Negative for fever and chills.  Respiratory: Negative for cough and shortness of breath.   Cardiovascular: Negative for chest pain and palpitations.  Gastrointestinal: Positive for nausea and vomiting. Negative for abdominal pain, diarrhea and constipation.  Musculoskeletal: Positive for arthralgias ( bilateral shoulder pain), myalgias and neck pain. Negative for back pain, gait problem and neck stiffness.  Neurological: Positive for dizziness and headaches. Negative for weakness and numbness.  All other systems reviewed and are negative.     Allergies  Shrimp  Home Medications   Prior to Admission medications   Medication Sig Start Date End Date Taking? Authorizing Provider  Butalbital-APAP-Caffeine 50-300-40 MG CAPS Take 1 capsule by mouth every 4 (four) hours as needed (for headache).   Yes Historical Provider, MD  cyclobenzaprine (FLEXERIL) 10 MG tablet Take 10 mg by mouth 3 (three) times daily as needed for muscle spasms.   Yes Historical Provider, MD  famotidine (PEPCID) 20 MG tablet Take 20 mg by mouth daily.   Yes Historical Provider, MD  FLUoxetine (PROZAC) 20 MG capsule Take 20 mg by mouth daily.   Yes Historical Provider, MD  Multiple Vitamins-Minerals (ONE-A-DAY WOMENS 50+ ADVANTAGE PO) Take 1 tablet by mouth daily.   Yes Historical Provider, MD  meloxicam (MOBIC) 7.5 MG tablet Take 1 tablet (7.5 mg total) by mouth daily. 12/13/13  Junius FinnerErin O'Malley, PA-C   BP 116/76  Pulse 71  Temp(Src) 98.2 F (36.8 C) (Oral)  Resp 18  SpO2 98% Physical Exam  Nursing note and vitals reviewed. Constitutional: She is oriented to person, place, and time. She appears well-developed and well-nourished. No distress.  HENT:  Head: Normocephalic and atraumatic.  Eyes:  Conjunctivae and EOM are normal. Pupils are equal, round, and reactive to light. No scleral icterus.  Neck: Normal range of motion. Neck supple.  No midline bone tenderness, no crepitus or step-offs.  No nuchal rigidity or meningeal signs. Mild tenderness to left and right cervical paraspinal muscles and left upper trapezius muscle.  Cardiovascular: Normal rate, regular rhythm and normal heart sounds.   Pulmonary/Chest: Effort normal and breath sounds normal. No respiratory distress. She has no wheezes. She has no rales. She exhibits no tenderness.  Abdominal: Soft. Bowel sounds are normal. She exhibits no distension and no mass. There is no tenderness. There is no rebound and no guarding.  Musculoskeletal: Normal range of motion.  No midline spinal tenderness, tenderness in left upper trapezius muscle. FROM left shoulder.   Neurological: She is alert and oriented to person, place, and time. No cranial nerve deficit. Coordination normal.  Neurologic Exam: MENTAL STATUS: awake, alert, Language fluent Follows all commands without difficulty.   CRANIAL NERVES: pupils equal and reactive to light, extraocular muscles intact, no nystagmus, facial sensation and strength symmetric, uvula midline   MOTOR: Strength symmetric, 5/5 SENSORY: normal and symmetric to light touch   COORDINATION: finger-nose-finger normal   REFLEXES: deep tendon reflexes present and symmetric - no babinski   GAIT: normal. No ataxia.   Skin: Skin is warm and dry. She is not diaphoretic.    ED Course  Procedures (including critical care time) Labs Review Labs Reviewed  BASIC METABOLIC PANEL - Abnormal; Notable for the following:    Glucose, Bld 106 (*)    All other components within normal limits  URINALYSIS, ROUTINE W REFLEX MICROSCOPIC - Abnormal; Notable for the following:    Specific Gravity, Urine 1.004 (*)    Hgb urine dipstick SMALL (*)    All other components within normal limits  CBC  URINE MICROSCOPIC-ADD ON     Imaging Review Ct Angio Head W/cm &/or Wo Cm  12/13/2013   CLINICAL DATA:  Dizziness and neck pain beginning 2 days ago. Headache with presyncope.  EXAM: CT ANGIOGRAPHY HEAD AND NECK  TECHNIQUE: Multidetector CT imaging of the head and neck was performed using the standard protocol during bolus administration of intravenous contrast. Multiplanar CT image reconstructions and MIPs were obtained to evaluate the vascular anatomy. Carotid stenosis measurements (when applicable) are obtained utilizing NASCET criteria, using the distal internal carotid diameter as the denominator.  CONTRAST:  80mL OMNIPAQUE IOHEXOL 350 MG/ML SOLN  COMPARISON:  None.  FINDINGS: CTA HEAD FINDINGS  No evidence for acute infarction, hemorrhage, mass lesion, hydrocephalus, or extra-axial fluid. There is no atrophy or white matter disease. Post infusion, no abnormal enhancement. Dural ossification anteriorly over the LEFT frontal lobe. Unremarkable calvarium. Moderate fluid accumulation in the RIGHT maxillary sinus appears to represent a retention cyst. No sinus air-fluid level. No mastoid fluid. No worrisome osseous lesions.  The internal carotid arteries are widely patent. The basilar artery is widely patent with vertebrals codominant. There is no intracranial stenosis or visible aneurysm. No cortical branch occlusion is evident. Major dural venous sinuses are patent.  Review of the MIP images confirms the above findings.  CTA NECK FINDINGS  Conventional branching of the great vessels from the arch. No proximal stenosis.  Carotid bifurcations are free of disease. No cervical internal carotid artery dissection or fibromuscular dysplasia.  Both vertebral arteries are widely patent from their origins through the neck into the posterior fossa.  Cervical spondylosis with disc space narrowing and mild degenerative slip at C3-4 and C4-5. Disc space narrowing with normal alignment C5-6. Moderate facet disease throughout the cervical region,  most notable at C2-3 on the LEFT. No neck masses. No lung apex lesion. Small air cyst noted in a subpleural location in the lung apex on the LEFT without pneumothorax.  Review of the MIP images confirms the above findings.  IMPRESSION: Unremarkable CTA head and neck. No vascular occlusion or dissection. No acute intracranial findings.  Cervical spondylosis.  Chronic RIGHT maxillary sinusitis.   Electronically Signed   By: Davonna Belling M.D.   On: 12/13/2013 12:24   Ct Angio Neck W/cm &/or Wo/cm  12/13/2013   CLINICAL DATA:  Dizziness and neck pain beginning 2 days ago. Headache with presyncope.  EXAM: CT ANGIOGRAPHY HEAD AND NECK  TECHNIQUE: Multidetector CT imaging of the head and neck was performed using the standard protocol during bolus administration of intravenous contrast. Multiplanar CT image reconstructions and MIPs were obtained to evaluate the vascular anatomy. Carotid stenosis measurements (when applicable) are obtained utilizing NASCET criteria, using the distal internal carotid diameter as the denominator.  CONTRAST:  80mL OMNIPAQUE IOHEXOL 350 MG/ML SOLN  COMPARISON:  None.  FINDINGS: CTA HEAD FINDINGS  No evidence for acute infarction, hemorrhage, mass lesion, hydrocephalus, or extra-axial fluid. There is no atrophy or white matter disease. Post infusion, no abnormal enhancement. Dural ossification anteriorly over the LEFT frontal lobe. Unremarkable calvarium. Moderate fluid accumulation in the RIGHT maxillary sinus appears to represent a retention cyst. No sinus air-fluid level. No mastoid fluid. No worrisome osseous lesions.  The internal carotid arteries are widely patent. The basilar artery is widely patent with vertebrals codominant. There is no intracranial stenosis or visible aneurysm. No cortical branch occlusion is evident. Major dural venous sinuses are patent.  Review of the MIP images confirms the above findings.  CTA NECK FINDINGS  Conventional branching of the great vessels from the  arch. No proximal stenosis.  Carotid bifurcations are free of disease. No cervical internal carotid artery dissection or fibromuscular dysplasia.  Both vertebral arteries are widely patent from their origins through the neck into the posterior fossa.  Cervical spondylosis with disc space narrowing and mild degenerative slip at C3-4 and C4-5. Disc space narrowing with normal alignment C5-6. Moderate facet disease throughout the cervical region, most notable at C2-3 on the LEFT. No neck masses. No lung apex lesion. Small air cyst noted in a subpleural location in the lung apex on the LEFT without pneumothorax.  Review of the MIP images confirms the above findings.  IMPRESSION: Unremarkable CTA head and neck. No vascular occlusion or dissection. No acute intracranial findings.  Cervical spondylosis.  Chronic RIGHT maxillary sinusitis.   Electronically Signed   By: Davonna Belling M.D.   On: 12/13/2013 12:24     EKG Interpretation None      MDM   Final diagnoses:  Episodic tension-type headache, not intractable  Dizziness  DDD (degenerative disc disease), cervical    Pt is a 53yo Hispanic female c/o left sided neck pain with headache, dizziness, and nausea. Seen by PCP, given medications for tension headache w/o relief of symptoms. Exam-benign, normal neuro exam, no evidence of meningitis.  Discussed pt with Dr. Wilkie Aye, some concern for dissection due to symptoms not relieved with outpatient care.  CT angio head and neck obtained.  No acute intracranial findings. Significant for spondylosis, otherwise, unremarkable.    12:36 PM Reviewed CT scans with pt and family. Pt states pain has improved from 8/10 to 6/10 while in the ED after given percocet, antivert, and ibuprofen.  Pt states she is no longer dizzy.  Will discharge pt home with mobic for mild cervical DDD.  Advised to f/u with PCP in 2-3 days for recheck of symptoms. Return precautions provided. Pt verbalized understanding and agreement with tx  plan.     Junius Finner, PA-C 12/13/13 [phone]

## 2013-12-13 NOTE — ED Notes (Signed)
Pt started feeling nauseous, dizzy, having neck pain, and a headache about two days ago. Pt feels like she could pass out when she stands.

## 2013-12-13 NOTE — ED Notes (Signed)
Pt remains monitored by blood pressure and pulse ox. Pts family remains at bedside.  

## 2013-12-13 NOTE — ED Provider Notes (Signed)
Medical screening examination/treatment/procedure(s) were performed by non-physician practitioner and as supervising physician I was immediately available for consultation/collaboration.   EKG Interpretation None        Courtney F Horton, MD 12/13/13 1836 

## 2013-12-13 NOTE — ED Notes (Signed)
Pts IV removed pt getting dressed and awaiting discharge paperwork at bedside.  

## 2013-12-17 NOTE — Discharge Planning (Signed)
[facility] was not able to see patient, [facility] orange card information and resources will be sent to the address provided. °

## 2014-12-29 ENCOUNTER — Telehealth: Payer: Self-pay | Admitting: Internal Medicine

## 2014-12-29 NOTE — Telephone Encounter (Signed)
[facility] L[facility] for appointment with Dr Rennis GoldenHilty on 01/05/15.  [facility]. lp

## 2015-01-05 ENCOUNTER — Encounter: Payer: Self-pay | Admitting: Internal Medicine

## 2015-01-05 ENCOUNTER — Ambulatory Visit (INDEPENDENT_AMBULATORY_CARE_PROVIDER_SITE_OTHER): Payer: Self-pay | Admitting: Internal Medicine

## 2015-01-05 VITALS — BP 112/72 | HR 81 | Ht 64.25 in | Wt 156.7 lb

## 2015-01-05 DIAGNOSIS — F419 Anxiety disorder, unspecified: Secondary | ICD-10-CM

## 2015-01-05 DIAGNOSIS — K2100 Gastro-esophageal reflux disease with esophagitis, without bleeding: Secondary | ICD-10-CM

## 2015-01-05 DIAGNOSIS — R0789 Other chest pain: Secondary | ICD-10-CM

## 2015-01-05 DIAGNOSIS — R002 Palpitations: Secondary | ICD-10-CM

## 2015-01-05 NOTE — Progress Notes (Signed)
OFFICE NOTE  Chief Complaint:  Chest pain, palpitations, anxiety  Primary Care Physician: Sandre KittyLONERGAN, MALIA A, PA-C  HPI:  Shannon Eaton  Is a pleasant 54 year old female was kindly referred to me for evaluation of an abnormal EKG, chest pain, palpitations and anxiety. This visit was facilitated by a Engineer, structuralpanish translator. After some discussion Shannon Eaton describes episodes of a squeezing chest pressure with sweating and palpitations that are preceded by significant anxiety events. This was well detailed in her primary care provider's note. This also prompted adjustments in her antianxiety/antidepressant medications. She did receive an EKG which showed a Q-wave findings in  Leads 3 and aVF , concerning for possible prior MI. I reviewed this EKG which shows negative deflections however they do not meet criteria for Q-wave because they are not at least a 1 small box wide. I repeated the EKG in the office today and it no longer shows a negative deflection in lead aVF but does show a small negative deflection in lead 3 which is a normal finding. Therefore I did not feel there is evidence of prior MI. She does have some nonspecific T-wave changes. She has a history of hypertension and dyslipidemia but no significant family history of coronary disease. She denies any anginal symptoms with exertion. She also denies any shortness of breath with exertion. She has several episodes which sound like reflux and she takes Prilosec on an as-needed basis. I told her that this is not likely to be helpful therapy as Prilosec does not work on an as-needed basis. We did give her some alternatives including Pepcid or Zantac to take as needed.  PMHx:  No past medical history on file.  Past Surgical History  Procedure Laterality Date  . Cholecystectomy N/A 10/23/2012    Procedure: LAPAROSCOPIC CHOLECYSTECTOMY WITH INTRAOPERATIVE CHOLANGIOGRAM;  Surgeon: Currie Parishristian J Streck, MD;  Location: [facility] OR;  Service: General;   Laterality: N/A;    FAMHx:  Family History  Problem Relation Age of Onset  . Other Mother   . Other Father     SOCHx:   reports that she has never smoked. She has never used smokeless tobacco. She reports that she does not drink alcohol or use illicit drugs.  ALLERGIES:  Allergies  Allergen Reactions  . Shrimp [Shellfish Allergy] Other (See Comments)    Shrimp ceviche causes tongues and gums to swell    ROS: A comprehensive review of systems was negative except for: Constitutional: positive for fatigue Cardiovascular: positive for chest pain and palpitations Gastrointestinal: positive for reflux symptoms Behavioral/Psych: positive for anxiety  HOME MEDS: Current Outpatient Prescriptions  Medication Sig Dispense Refill  . diclofenac (VOLTAREN) 50 MG EC tablet Take 50 mg by mouth 2 (two) times daily as needed.    Marland Kitchen. lisinopril (PRINIVIL,ZESTRIL) 40 MG tablet Take 40 mg by mouth daily.    Marland Kitchen. lovastatin (MEVACOR) 40 MG tablet Take 40 mg by mouth at bedtime.    . Multiple Vitamins-Minerals (ONE-A-DAY WOMENS 50+ ADVANTAGE PO) Take 1 tablet by mouth daily.    Marland Kitchen. tiZANidine (ZANAFLEX) 4 MG tablet Take 4 mg by mouth every 8 (eight) hours as needed for muscle spasms.    Marland Kitchen. venlafaxine XR (EFFEXOR-XR) 150 MG 24 hr capsule Take 150 mg by mouth daily with breakfast.     No current facility-administered medications for this visit.    LABS/IMAGING: No results found for this or any previous visit (from the past 48 hour(s)). No results found.  WEIGHTS: Wt Readings from Last 3  Encounters:  01/05/15 156 lb 11.2 oz (71.079 kg)  07/26/13 147 lb 12.8 oz (67.042 kg)  06/22/13 148 lb (67.132 kg)    VITALS: BP 112/72 mmHg  Pulse 81  Ht 5' 4.25" (1.632 m)  Wt 156 lb 11.2 oz (71.079 kg)  BMI 26.69 kg/m2  EXAM: General appearance: alert and no distress Neck: no carotid bruit and no JVD Lungs: clear to auscultation bilaterally Heart: regular rate and rhythm, S1, S2 normal, no murmur,  click, rub or gallop Abdomen: soft, non-tender; bowel sounds normal; no masses,  no organomegaly Extremities: extremities normal, atraumatic, no cyanosis or edema Pulses: 2+ and symmetric Skin: Skin color, texture, turgor normal. No rashes or lesions Neurologic: Grossly normal  EKG: Normal sinus rhythm at 81, non-specific T wave changes  ASSESSMENT: 1. Chest pain and palpitations related to anxiety 2. Hypertension-untreated 3. Dyslipidemia 4. GERD  PLAN: 1.   Shannon Eaton seems to have significant anxiety and is likely having palpitations and discomfort in the chest related to this. Although there is some mild EKG abnormalities I did not feel that this is cardiac. I did offer to exercise treadmill stress test and monitoring however she declined. She wants to give the medicine a chance to work to see if it treating her anxiety improves her symptoms. This is certainly a reasonable option however I did encourage her to call me back if she should have some more symptoms. We did advise her not to take Prilosec as needed but to consider ranitidine or Pepcid as needed. Some of her symptoms do sound like reflux. Will plan to see her back in one month to see if her symptoms have improved and certainly if she feels worse we may consider testing.  Thanks for the kind referral.  Chrystie Nose, MD, [facility] Attending Cardiologist [facility]  Chrystie Nose 01/05/2015, 10:07 AM

## 2015-01-05 NOTE — Patient Instructions (Signed)
Your physician recommends that you schedule a follow-up appointment in ONE MONTH with Dr. Hilty.  

## 2015-02-07 ENCOUNTER — Ambulatory Visit: Payer: Self-pay | Admitting: Internal Medicine

## 2016-01-18 IMAGING — CT CT ABD-PELV W/ CM
2 of 5 series · 17 of 46 positions shown, 19 images · IV contrast (CONTRAST)
Comparison: none

[Series 2: routine · axial · 0.62mm/px · z∈[-795,-375]mm · 14 of 94 slices shown, 16 images]
[im 5/94  soft-tissue]
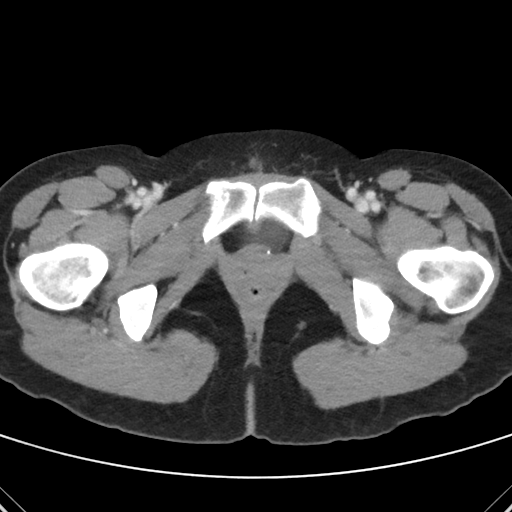
[im 5/94  bone]
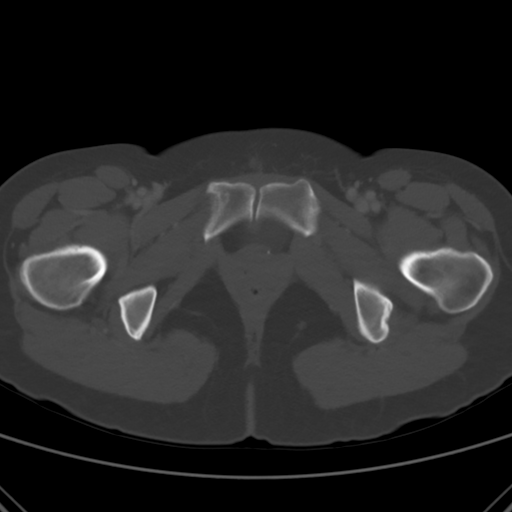
[im 10/94  soft-tissue]
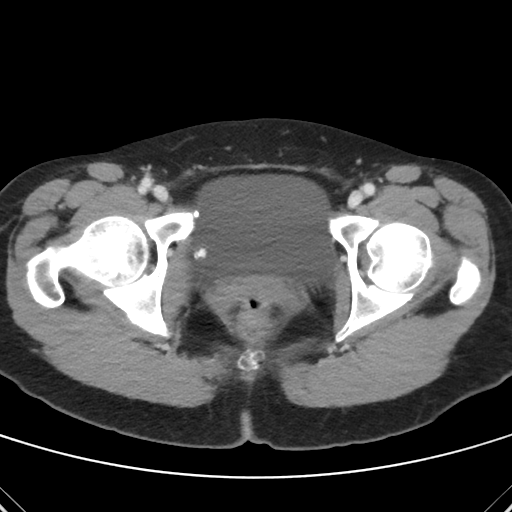
[im 20/94  soft-tissue]
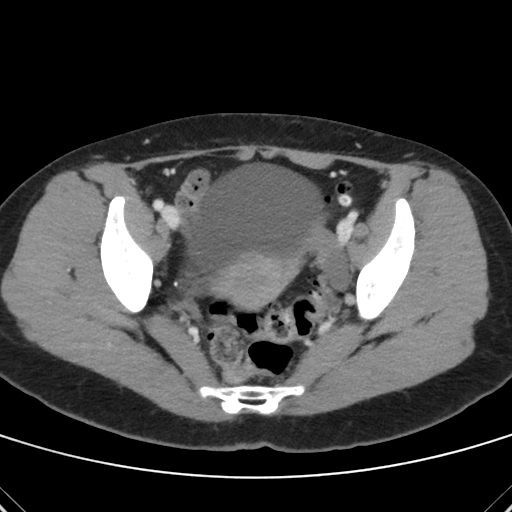
[im 25/94  soft-tissue]
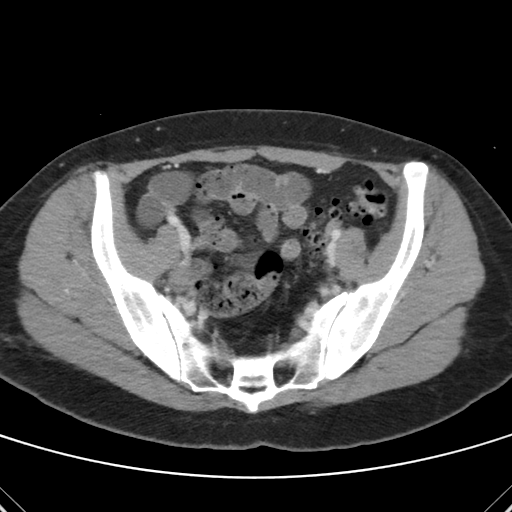
[im 30/94  soft-tissue]
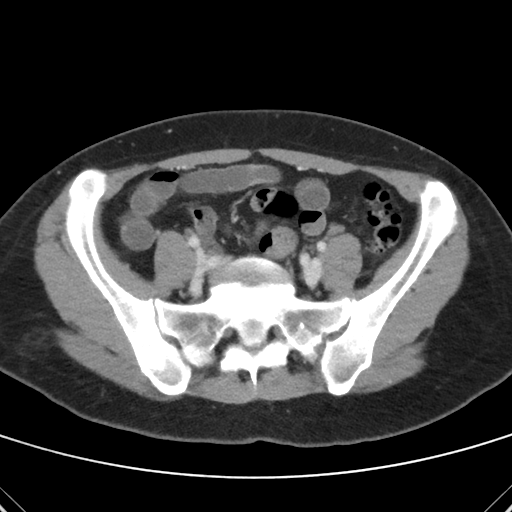
[im 40/94  soft-tissue]
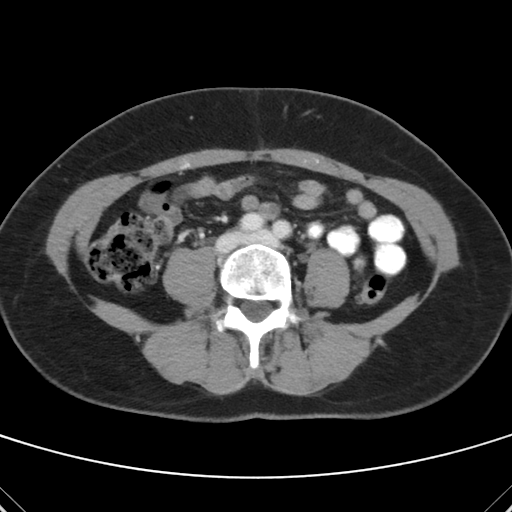
[im 45/94  soft-tissue]
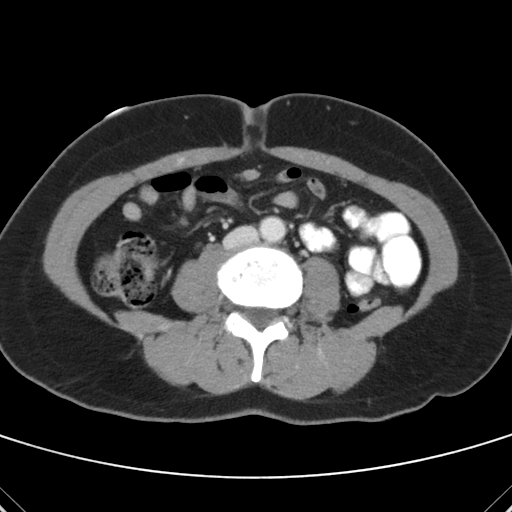
[im 49/94  soft-tissue]
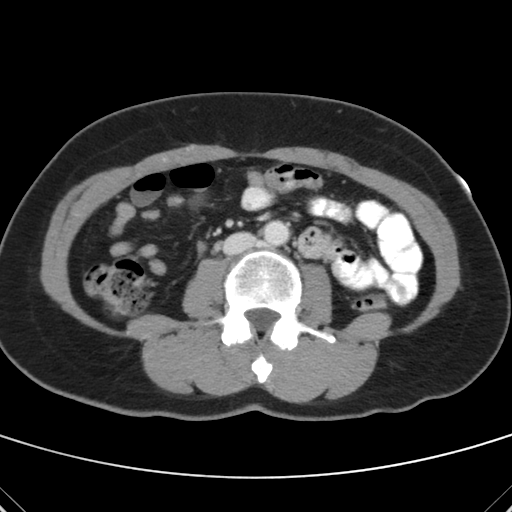
[im 54/94  soft-tissue]
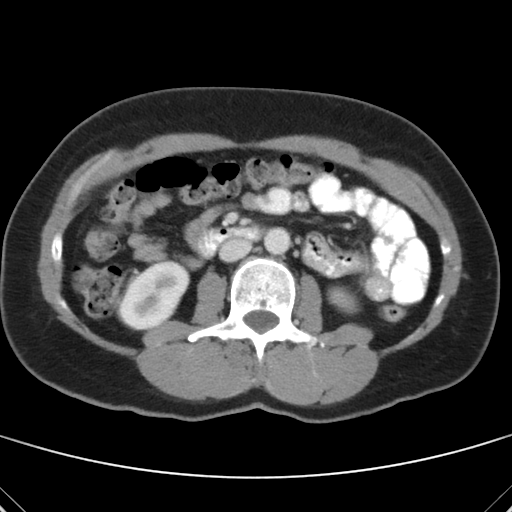
[im 54/94  bone]
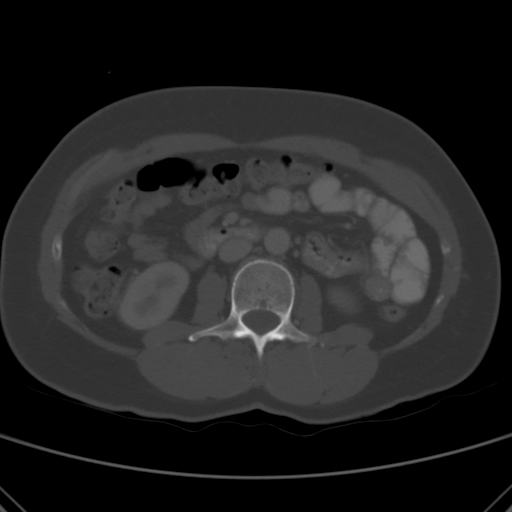
[im 64/94  soft-tissue]
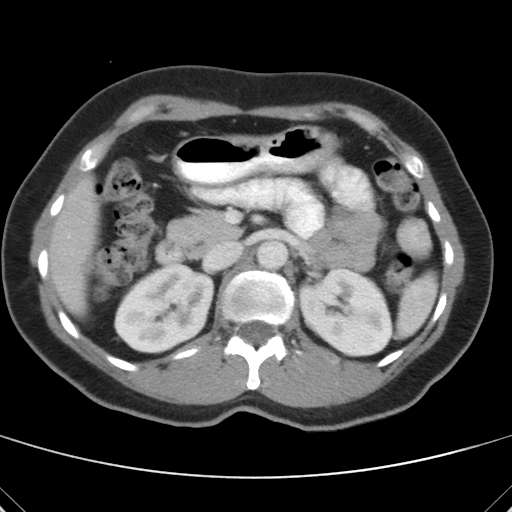
[im 69/94  soft-tissue]
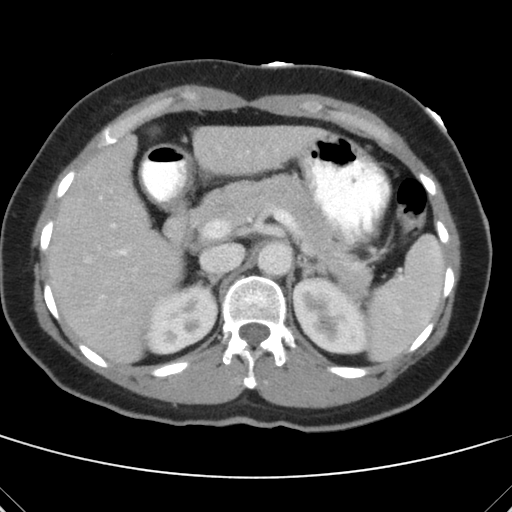
[im 74/94  soft-tissue]
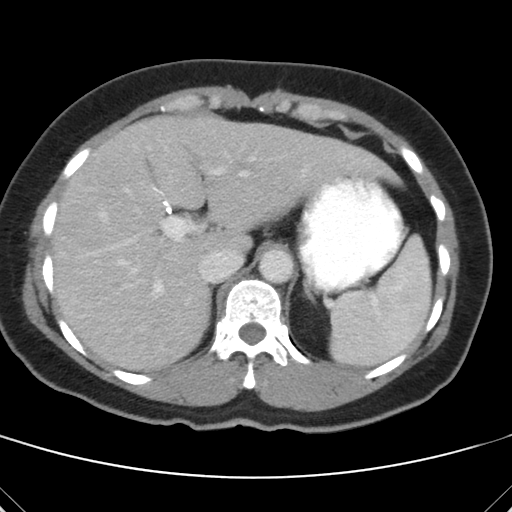
[im 84/94  soft-tissue]
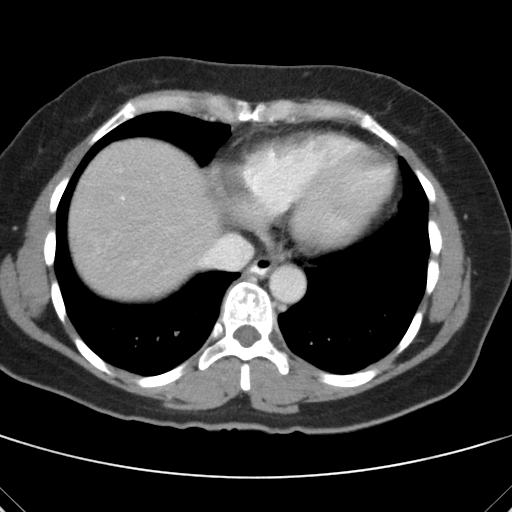
[im 89/94  soft-tissue]
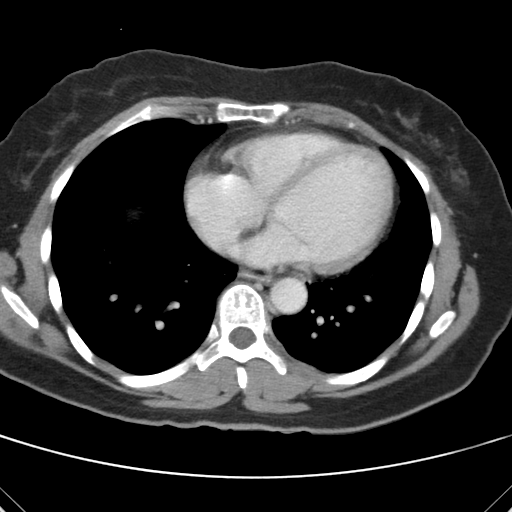

[coronal · coronal · 0.91mm/px · 3 of 94 slices shown]
[im 32/94  soft-tissue]
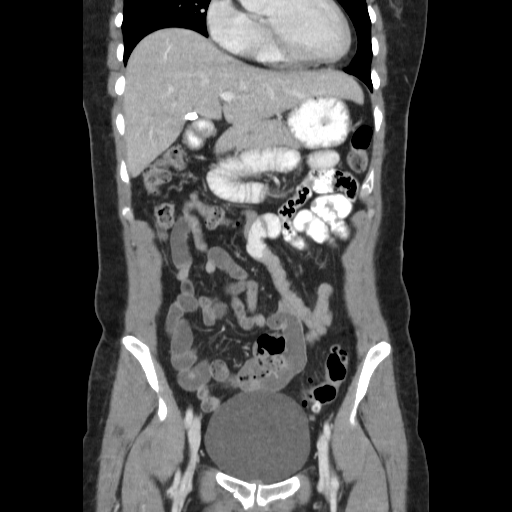
[im 42/94  soft-tissue]
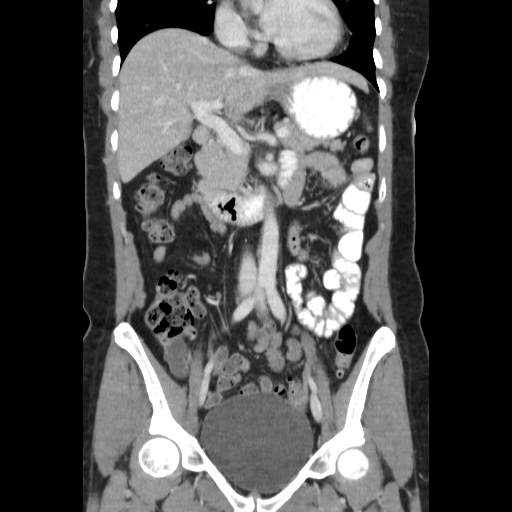
[im 52/94  soft-tissue]
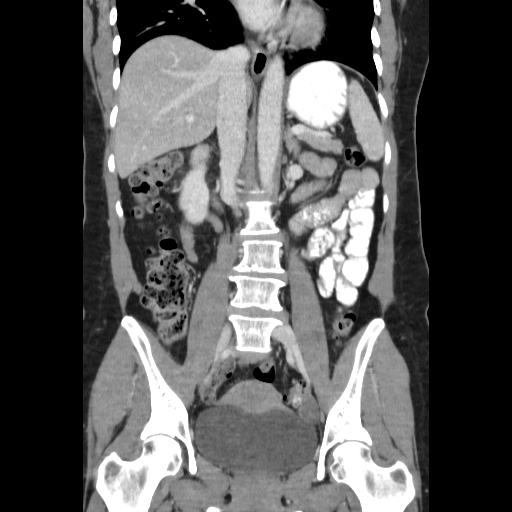

[17 of 46 positions shown; findings below may reference images not displayed]

CLINICAL DATA
Low back pain and lower and upper abdominal pain with weakness.
Depression and nausea.

EXAM
CT ABDOMEN AND PELVIS WITH CONTRAST

TECHNIQUE
Multidetector CT imaging of the abdomen and pelvis was performed
using the standard protocol following bolus administration of
intravenous contrast.

CONTRAST
100mL OMNIPAQUE IOHEXOL 300 MG/ML  SOLN

COMPARISON
None.

FINDINGS
Lung Bases: Dependent atelectasis.

Liver: Tiny cyst is present in the right hepatic lobe posteriorly.
Another tiny cyst is present in the left hepatic lobe.

Spleen:  Normal.

Gallbladder:  Cholecystectomy.

Common bile duct:  Normal.

Pancreas:  Normal.

Adrenal glands:  Normal bilaterally.

Kidneys: Normal renal enhancement and delayed excretion of contrast.
Both ureters appear normal.

Stomach:  Normal.

Small bowel: Fecalization of distal small bowel all without
obstruction or dilation. Findings suggest stasis.

Colon: Normal appendix. No colonic inflammatory changes. Colonic
diverticulosis without diverticulitis.

Pelvic Genitourinary: Distended urinary bladder. Uterus and adnexa
appear normal.

Bones: No aggressive osseous lesions. L3-L4 partially calcified disc
protrusion.

Vasculature: Normal.

Body Wall: Fat containing periumbilical hernia.

IMPRESSION
No acute abnormality.

SIGNATURE

## 2016-07-24 ENCOUNTER — Encounter: Payer: Self-pay | Admitting: Gynecology

## 2019-06-28 ENCOUNTER — Encounter: Payer: Self-pay | Admitting: Medical
